# Patient Record
Sex: Female | Born: 1975 | Race: White | Hispanic: No | Marital: Single | Smoking: Former smoker
Health system: Southern US, Community
[De-identification: ages and names within clinical notes are randomized; demographics above are authoritative.]

## PROBLEM LIST (undated history)

## (undated) DIAGNOSIS — E119 Type 2 diabetes mellitus without complications: Secondary | ICD-10-CM

## (undated) DIAGNOSIS — N83201 Unspecified ovarian cyst, right side: Secondary | ICD-10-CM

## (undated) DIAGNOSIS — N83202 Unspecified ovarian cyst, left side: Secondary | ICD-10-CM

## (undated) DIAGNOSIS — K5792 Diverticulitis of intestine, part unspecified, without perforation or abscess without bleeding: Secondary | ICD-10-CM

## (undated) DIAGNOSIS — N200 Calculus of kidney: Secondary | ICD-10-CM

## (undated) HISTORY — PX: COLECTOMY: SHX59

## (undated) HISTORY — PX: ECTOPIC PREGNANCY SURGERY: SHX613

## (undated) HISTORY — PX: ABDOMINAL SURGERY: SHX537

## (undated) HISTORY — PX: ANKLE SURGERY: SHX546

---

## 2008-05-03 ENCOUNTER — Emergency Department: Payer: Self-pay | Admitting: Emergency Medicine

## 2008-12-15 ENCOUNTER — Emergency Department: Payer: Self-pay | Admitting: Emergency Medicine

## 2008-12-16 ENCOUNTER — Ambulatory Visit: Payer: Self-pay | Admitting: Emergency Medicine

## 2009-11-24 ENCOUNTER — Emergency Department: Payer: Self-pay | Admitting: Emergency Medicine

## 2010-03-28 ENCOUNTER — Ambulatory Visit: Payer: Self-pay | Admitting: Podiatry

## 2010-04-07 ENCOUNTER — Ambulatory Visit: Payer: Self-pay | Admitting: Podiatry

## 2010-04-15 ENCOUNTER — Ambulatory Visit: Payer: Self-pay | Admitting: Podiatry

## 2010-06-11 ENCOUNTER — Emergency Department: Payer: Self-pay | Admitting: Emergency Medicine

## 2010-07-02 ENCOUNTER — Inpatient Hospital Stay: Payer: Self-pay | Admitting: Unknown Physician Specialty

## 2010-07-20 ENCOUNTER — Emergency Department (HOSPITAL_COMMUNITY): Admission: EM | Admit: 2010-07-20 | Discharge: 2010-07-21 | Payer: Self-pay | Admitting: Emergency Medicine

## 2010-07-21 ENCOUNTER — Ambulatory Visit: Payer: Self-pay | Admitting: Psychiatry

## 2010-07-21 ENCOUNTER — Inpatient Hospital Stay (HOSPITAL_COMMUNITY): Admission: RE | Admit: 2010-07-21 | Discharge: 2010-07-24 | Payer: Self-pay | Admitting: Psychiatry

## 2010-11-09 ENCOUNTER — Emergency Department: Payer: Self-pay | Admitting: Emergency Medicine

## 2011-01-05 LAB — COMPREHENSIVE METABOLIC PANEL
CO2: 24 mEq/L (ref 19–32)
Calcium: 8.6 mg/dL (ref 8.4–10.5)
Creatinine, Ser: 1.32 mg/dL — ABNORMAL HIGH (ref 0.4–1.2)
GFR calc Af Amer: 55 mL/min — ABNORMAL LOW (ref >60)
GFR calc non Af Amer: 46 mL/min — ABNORMAL LOW (ref >60)
Glucose, Bld: 99 mg/dL (ref 70–99)
Total Protein: 6.9 g/dL (ref 6.0–8.3)

## 2011-01-05 LAB — DIFFERENTIAL
Basophils Absolute: 0.2 10*3/uL — ABNORMAL HIGH (ref 0.0–0.1)
Basophils Relative: 2 % — ABNORMAL HIGH (ref 0–1)
Eosinophils Absolute: 0.1 10*3/uL (ref 0.0–0.7)
Eosinophils Relative: 1 % (ref 0–5)
Lymphocytes Relative: 39 % (ref 12–46)
Lymphs Abs: 3.8 10*3/uL (ref 0.7–4.0)
Neutrophils Relative %: 52 % (ref 43–77)

## 2011-01-05 LAB — POCT PREGNANCY, URINE: Preg Test, Ur: NEGATIVE

## 2011-01-05 LAB — RAPID URINE DRUG SCREEN, HOSP PERFORMED
Amphetamines: NOT DETECTED
Benzodiazepines: POSITIVE — AB
Cocaine: POSITIVE — AB

## 2011-01-05 LAB — CBC
HCT: 37.2 % (ref 36.0–46.0)
Hemoglobin: 12.9 g/dL (ref 12.0–15.0)
MCH: 32.5 pg (ref 26.0–34.0)
MCHC: 34.6 g/dL (ref 30.0–36.0)
RDW: 13.7 % (ref 11.5–15.5)

## 2011-01-05 LAB — TSH: TSH: 2.007 u[IU]/mL (ref 0.350–4.500)

## 2011-02-23 ENCOUNTER — Ambulatory Visit: Payer: Self-pay | Admitting: Podiatry

## 2011-04-18 ENCOUNTER — Ambulatory Visit: Payer: Self-pay | Admitting: Podiatry

## 2011-07-14 ENCOUNTER — Emergency Department: Payer: Self-pay | Admitting: Emergency Medicine

## 2011-09-22 ENCOUNTER — Emergency Department: Payer: Self-pay | Admitting: Emergency Medicine

## 2011-09-24 ENCOUNTER — Inpatient Hospital Stay: Payer: Self-pay | Admitting: Surgery

## 2011-10-03 LAB — PATHOLOGY REPORT

## 2011-10-23 ENCOUNTER — Emergency Department: Payer: Self-pay | Admitting: Emergency Medicine

## 2012-06-30 ENCOUNTER — Emergency Department: Payer: Self-pay | Admitting: Internal Medicine

## 2012-06-30 LAB — COMPREHENSIVE METABOLIC PANEL
Albumin: 3.6 g/dL (ref 3.4–5.0)
Alkaline Phosphatase: 83 U/L (ref 50–136)
Anion Gap: 7 (ref 7–16)
BUN: 16 mg/dL (ref 7–18)
Bilirubin,Total: 0.3 mg/dL (ref 0.2–1.0)
Calcium, Total: 8.9 mg/dL (ref 8.5–10.1)
Chloride: 105 mmol/L (ref 98–107)
Creatinine: 0.94 mg/dL (ref 0.60–1.30)
EGFR (African American): >60
EGFR (Non-African Amer.): >60
Glucose: 149 mg/dL — ABNORMAL HIGH (ref 65–99)
SGPT (ALT): 37 U/L (ref 12–78)
Total Protein: 8 g/dL (ref 6.4–8.2)

## 2012-06-30 LAB — URINALYSIS, COMPLETE
Bilirubin,UR: NEGATIVE
Leukocyte Esterase: NEGATIVE
Nitrite: NEGATIVE
Ph: 6 (ref 4.5–8.0)
Protein: NEGATIVE
RBC,UR: 3 /HPF (ref 0–5)

## 2012-06-30 LAB — CBC
MCH: 31.6 pg (ref 26.0–34.0)
RBC: 4.21 10*6/uL (ref 3.80–5.20)
RDW: 13.1 % (ref 11.5–14.5)
WBC: 9.5 10*3/uL (ref 3.6–11.0)

## 2012-07-06 LAB — CULTURE, BLOOD (SINGLE)

## 2012-09-01 LAB — URINALYSIS, COMPLETE
Bacteria: NONE SEEN
Blood: NEGATIVE
Ketone: NEGATIVE
Leukocyte Esterase: NEGATIVE
Ph: 6 (ref 4.5–8.0)
Protein: NEGATIVE
Specific Gravity: 1.025 (ref 1.003–1.030)
WBC UR: 1 /HPF (ref 0–5)

## 2012-09-01 LAB — COMPREHENSIVE METABOLIC PANEL
Albumin: 3.4 g/dL (ref 3.4–5.0)
Alkaline Phosphatase: 75 U/L (ref 50–136)
BUN: 12 mg/dL (ref 7–18)
Bilirubin,Total: 0.3 mg/dL (ref 0.2–1.0)
Creatinine: 1.01 mg/dL (ref 0.60–1.30)
EGFR (Non-African Amer.): >60
Glucose: 170 mg/dL — ABNORMAL HIGH (ref 65–99)
Osmolality: 283 (ref 275–301)
Potassium: 4.2 mmol/L (ref 3.5–5.1)
Sodium: 140 mmol/L (ref 136–145)
Total Protein: 7.5 g/dL (ref 6.4–8.2)

## 2012-09-02 ENCOUNTER — Inpatient Hospital Stay: Payer: Self-pay | Admitting: Internal Medicine

## 2012-09-02 LAB — CBC
HCT: 34.7 % — ABNORMAL LOW (ref 35.0–47.0)
MCH: 31.7 pg (ref 26.0–34.0)
MCHC: 34.2 g/dL (ref 32.0–36.0)
Platelet: 197 10*3/uL (ref 150–440)

## 2012-09-03 LAB — CBC WITH DIFFERENTIAL/PLATELET
Basophil %: 0.6 %
Eosinophil %: 1.1 %
HCT: 33.5 % — ABNORMAL LOW (ref 35.0–47.0)
HGB: 11.7 g/dL — ABNORMAL LOW (ref 12.0–16.0)
Lymphocyte %: 20 %
MCH: 32 pg (ref 26.0–34.0)
Monocyte #: 0.7 x10 3/mm (ref 0.2–0.9)
Monocyte %: 6.8 %
Neutrophil #: 7 10*3/uL — ABNORMAL HIGH (ref 1.4–6.5)
Neutrophil %: 71.5 %
Platelet: 187 10*3/uL (ref 150–440)
RBC: 3.65 10*6/uL — ABNORMAL LOW (ref 3.80–5.20)
WBC: 9.8 10*3/uL (ref 3.6–11.0)

## 2012-09-03 LAB — BASIC METABOLIC PANEL
Anion Gap: 8 (ref 7–16)
Chloride: 101 mmol/L (ref 98–107)
Co2: 27 mmol/L (ref 21–32)
Creatinine: 0.74 mg/dL (ref 0.60–1.30)
EGFR (African American): >60
EGFR (Non-African Amer.): >60
Osmolality: 271 (ref 275–301)
Potassium: 3.9 mmol/L (ref 3.5–5.1)

## 2012-09-05 LAB — CBC WITH DIFFERENTIAL/PLATELET
Basophil #: 0 10*3/uL (ref 0.0–0.1)
Eosinophil %: 1.5 %
HGB: 11.7 g/dL — ABNORMAL LOW (ref 12.0–16.0)
Lymphocyte #: 1.6 10*3/uL (ref 1.0–3.6)
Lymphocyte %: 21.6 %
Monocyte #: 0.5 x10 3/mm (ref 0.2–0.9)
Monocyte %: 6.8 %
Neutrophil #: 5.2 10*3/uL (ref 1.4–6.5)
Neutrophil %: 69.7 %
Platelet: 210 10*3/uL (ref 150–440)
RDW: 13.6 % (ref 11.5–14.5)
WBC: 7.5 10*3/uL (ref 3.6–11.0)

## 2012-11-29 ENCOUNTER — Emergency Department: Payer: Self-pay | Admitting: Emergency Medicine

## 2012-11-29 LAB — URINALYSIS, COMPLETE
Bilirubin,UR: NEGATIVE
Blood: NEGATIVE
Ketone: NEGATIVE
Nitrite: NEGATIVE
Ph: 5 (ref 4.5–8.0)
Protein: NEGATIVE
RBC,UR: <1 /HPF (ref 0–5)
Squamous Epithelial: 6
WBC UR: 1 /HPF (ref 0–5)

## 2012-11-29 LAB — COMPREHENSIVE METABOLIC PANEL
Albumin: 3.7 g/dL (ref 3.4–5.0)
Alkaline Phosphatase: 80 U/L (ref 50–136)
Anion Gap: 8 (ref 7–16)
BUN: 13 mg/dL (ref 7–18)
Calcium, Total: 8.5 mg/dL (ref 8.5–10.1)
Chloride: 107 mmol/L (ref 98–107)
Creatinine: 0.93 mg/dL (ref 0.60–1.30)
Glucose: 182 mg/dL — ABNORMAL HIGH (ref 65–99)
Osmolality: 282 (ref 275–301)
SGPT (ALT): 45 U/L (ref 12–78)
Sodium: 139 mmol/L (ref 136–145)
Total Protein: 7.7 g/dL (ref 6.4–8.2)

## 2012-11-29 LAB — CBC
HCT: 41.7 % (ref 35.0–47.0)
HGB: 14.2 g/dL (ref 12.0–16.0)
MCV: 92 fL (ref 80–100)
Platelet: 253 10*3/uL (ref 150–440)
RBC: 4.54 10*6/uL (ref 3.80–5.20)
RDW: 13.8 % (ref 11.5–14.5)

## 2012-11-29 LAB — LIPASE, BLOOD: Lipase: 116 U/L (ref 73–393)

## 2012-12-08 ENCOUNTER — Emergency Department: Payer: Self-pay | Admitting: Emergency Medicine

## 2012-12-08 LAB — COMPREHENSIVE METABOLIC PANEL
Albumin: 3.8 g/dL (ref 3.4–5.0)
Alkaline Phosphatase: 94 U/L (ref 50–136)
Anion Gap: 7 (ref 7–16)
BUN: 13 mg/dL (ref 7–18)
Calcium, Total: 9.4 mg/dL (ref 8.5–10.1)
Chloride: 104 mmol/L (ref 98–107)
EGFR (Non-African Amer.): >60
Osmolality: 279 (ref 275–301)
Potassium: 4.1 mmol/L (ref 3.5–5.1)
SGOT(AST): 25 U/L (ref 15–37)
SGPT (ALT): 45 U/L (ref 12–78)

## 2012-12-08 LAB — URINALYSIS, COMPLETE
Bilirubin,UR: NEGATIVE
Blood: NEGATIVE
Hyaline Cast: 3
Leukocyte Esterase: NEGATIVE
Nitrite: NEGATIVE
Protein: NEGATIVE
RBC,UR: <1 /HPF (ref 0–5)
Specific Gravity: 1.02 (ref 1.003–1.030)
Squamous Epithelial: 3

## 2012-12-08 LAB — CBC
HCT: 43.4 % (ref 35.0–47.0)
HGB: 14.4 g/dL (ref 12.0–16.0)
MCH: 30.6 pg (ref 26.0–34.0)
MCHC: 33.2 g/dL (ref 32.0–36.0)
MCV: 92 fL (ref 80–100)
RDW: 13.9 % (ref 11.5–14.5)

## 2012-12-12 ENCOUNTER — Other Ambulatory Visit: Payer: Self-pay | Admitting: Gastroenterology

## 2012-12-12 LAB — SEDIMENTATION RATE: Erythrocyte Sed Rate: 47 mm/hr — ABNORMAL HIGH (ref 0–20)

## 2013-01-08 ENCOUNTER — Ambulatory Visit: Payer: Self-pay | Admitting: Gastroenterology

## 2013-01-09 LAB — PATHOLOGY REPORT

## 2013-03-11 ENCOUNTER — Emergency Department: Payer: Self-pay | Admitting: Emergency Medicine

## 2013-03-11 LAB — COMPREHENSIVE METABOLIC PANEL
Albumin: 3.5 g/dL (ref 3.4–5.0)
Alkaline Phosphatase: 87 U/L (ref 50–136)
Anion Gap: 6 — ABNORMAL LOW (ref 7–16)
BUN: 13 mg/dL (ref 7–18)
Bilirubin,Total: 0.2 mg/dL (ref 0.2–1.0)
Co2: 25 mmol/L (ref 21–32)
EGFR (Non-African Amer.): >60
Glucose: 159 mg/dL — ABNORMAL HIGH (ref 65–99)
Potassium: 4.2 mmol/L (ref 3.5–5.1)
SGOT(AST): 25 U/L (ref 15–37)
Sodium: 138 mmol/L (ref 136–145)
Total Protein: 7.8 g/dL (ref 6.4–8.2)

## 2013-03-11 LAB — URINALYSIS, COMPLETE
Bilirubin,UR: NEGATIVE
Ketone: NEGATIVE
Leukocyte Esterase: NEGATIVE
Nitrite: NEGATIVE
Ph: 5 (ref 4.5–8.0)
Specific Gravity: 1.015 (ref 1.003–1.030)
Squamous Epithelial: 1
WBC UR: <1 /HPF (ref 0–5)

## 2013-03-11 LAB — CBC
HGB: 13.8 g/dL (ref 12.0–16.0)
MCH: 31.5 pg (ref 26.0–34.0)
MCHC: 34.5 g/dL (ref 32.0–36.0)
MCV: 91 fL (ref 80–100)
Platelet: 264 10*3/uL (ref 150–440)
RBC: 4.4 10*6/uL (ref 3.80–5.20)
RDW: 13.4 % (ref 11.5–14.5)
WBC: 10.2 10*3/uL (ref 3.6–11.0)

## 2013-03-11 LAB — LIPASE, BLOOD: Lipase: 98 U/L (ref 73–393)

## 2013-03-11 LAB — PREGNANCY, URINE: Pregnancy Test, Urine: NEGATIVE m[IU]/mL

## 2013-03-24 ENCOUNTER — Emergency Department: Payer: Self-pay | Admitting: Emergency Medicine

## 2013-03-24 LAB — COMPREHENSIVE METABOLIC PANEL
Albumin: 3.4 g/dL (ref 3.4–5.0)
Anion Gap: 5 — ABNORMAL LOW (ref 7–16)
BUN: 11 mg/dL (ref 7–18)
Bilirubin,Total: 0.4 mg/dL (ref 0.2–1.0)
Calcium, Total: 9.6 mg/dL (ref 8.5–10.1)
Co2: 29 mmol/L (ref 21–32)
Creatinine: 0.84 mg/dL (ref 0.60–1.30)
EGFR (African American): >60
Glucose: 123 mg/dL — ABNORMAL HIGH (ref 65–99)
Potassium: 3.7 mmol/L (ref 3.5–5.1)
SGOT(AST): 22 U/L (ref 15–37)
Sodium: 135 mmol/L — ABNORMAL LOW (ref 136–145)

## 2013-03-24 LAB — URINALYSIS, COMPLETE
Bilirubin,UR: NEGATIVE
Blood: NEGATIVE
Glucose,UR: NEGATIVE mg/dL (ref 0–75)
Leukocyte Esterase: NEGATIVE
Nitrite: NEGATIVE
Ph: 5 (ref 4.5–8.0)
Protein: NEGATIVE
Specific Gravity: 1.024 (ref 1.003–1.030)
Squamous Epithelial: 2

## 2013-03-24 LAB — CBC
HCT: 40.1 % (ref 35.0–47.0)
MCHC: 34.1 g/dL (ref 32.0–36.0)
MCV: 91 fL (ref 80–100)
Platelet: 307 10*3/uL (ref 150–440)
RBC: 4.41 10*6/uL (ref 3.80–5.20)

## 2013-03-24 LAB — HCG, QUANTITATIVE, PREGNANCY: Beta Hcg, Quant.: <1 m[IU]/mL — ABNORMAL LOW

## 2013-06-02 ENCOUNTER — Emergency Department: Payer: Self-pay | Admitting: Emergency Medicine

## 2013-06-02 LAB — URINALYSIS, COMPLETE
Bacteria: NONE SEEN
Blood: NEGATIVE
Ketone: NEGATIVE
Nitrite: NEGATIVE
Protein: NEGATIVE
RBC,UR: 1 /HPF (ref 0–5)
WBC UR: 2 /HPF (ref 0–5)

## 2013-06-02 LAB — COMPREHENSIVE METABOLIC PANEL
Albumin: 3.1 g/dL — ABNORMAL LOW (ref 3.4–5.0)
Alkaline Phosphatase: 89 U/L (ref 50–136)
Anion Gap: 5 — ABNORMAL LOW (ref 7–16)
BUN: 14 mg/dL (ref 7–18)
Calcium, Total: 8.8 mg/dL (ref 8.5–10.1)
Co2: 28 mmol/L (ref 21–32)
Glucose: 273 mg/dL — ABNORMAL HIGH (ref 65–99)
SGPT (ALT): 31 U/L (ref 12–78)
Sodium: 135 mmol/L — ABNORMAL LOW (ref 136–145)
Total Protein: 7.8 g/dL (ref 6.4–8.2)

## 2013-06-02 LAB — CBC
HCT: 36 % (ref 35.0–47.0)
MCH: 31.8 pg (ref 26.0–34.0)
MCHC: 35 g/dL (ref 32.0–36.0)
RBC: 3.96 10*6/uL (ref 3.80–5.20)

## 2013-06-02 LAB — TROPONIN I: Troponin-I: <0.02 ng/mL

## 2013-06-03 LAB — DRUG SCREEN, URINE
Amphetamines, Ur Screen: NEGATIVE (ref ?–1000)
Cocaine Metabolite,Ur ~~LOC~~: NEGATIVE (ref ?–300)
MDMA (Ecstasy)Ur Screen: NEGATIVE (ref ?–500)
Methadone, Ur Screen: NEGATIVE (ref ?–300)
Tricyclic, Ur Screen: NEGATIVE (ref ?–1000)

## 2013-06-29 ENCOUNTER — Emergency Department: Payer: Self-pay | Admitting: Emergency Medicine

## 2013-06-29 LAB — URINALYSIS, COMPLETE
Bilirubin,UR: NEGATIVE
Blood: NEGATIVE
Nitrite: NEGATIVE
Protein: NEGATIVE
RBC,UR: NONE SEEN /HPF (ref 0–5)
Specific Gravity: 1.018 (ref 1.003–1.030)
Squamous Epithelial: 9

## 2013-06-29 LAB — BASIC METABOLIC PANEL
Anion Gap: 3 — ABNORMAL LOW (ref 7–16)
BUN: 12 mg/dL (ref 7–18)
Creatinine: 0.92 mg/dL (ref 0.60–1.30)
Potassium: 4.1 mmol/L (ref 3.5–5.1)
Sodium: 134 mmol/L — ABNORMAL LOW (ref 136–145)

## 2013-06-29 LAB — CBC
HCT: 41.1 % (ref 35.0–47.0)
MCH: 31.2 pg (ref 26.0–34.0)
MCV: 90 fL (ref 80–100)
WBC: 11.2 10*3/uL — ABNORMAL HIGH (ref 3.6–11.0)

## 2013-06-29 LAB — HEPATIC FUNCTION PANEL A (ARMC)
Albumin: 3.5 g/dL (ref 3.4–5.0)
Alkaline Phosphatase: 108 U/L (ref 50–136)
Bilirubin,Total: 0.4 mg/dL (ref 0.2–1.0)
SGPT (ALT): 41 U/L (ref 12–78)

## 2013-06-29 LAB — PREGNANCY, URINE: Pregnancy Test, Urine: NEGATIVE m[IU]/mL

## 2013-07-22 ENCOUNTER — Emergency Department: Payer: Self-pay | Admitting: Emergency Medicine

## 2013-07-22 LAB — COMPREHENSIVE METABOLIC PANEL
Albumin: 3.6 g/dL (ref 3.4–5.0)
Anion Gap: 4 — ABNORMAL LOW (ref 7–16)
BUN: 13 mg/dL (ref 7–18)
Bilirubin,Total: 0.3 mg/dL (ref 0.2–1.0)
Chloride: 104 mmol/L (ref 98–107)
Creatinine: 1.2 mg/dL (ref 0.60–1.30)
EGFR (African American): >60
EGFR (Non-African Amer.): 57 — ABNORMAL LOW
Glucose: 213 mg/dL — ABNORMAL HIGH (ref 65–99)
Potassium: 4 mmol/L (ref 3.5–5.1)
SGOT(AST): 34 U/L (ref 15–37)
Total Protein: 8.5 g/dL — ABNORMAL HIGH (ref 6.4–8.2)

## 2013-07-22 LAB — CBC
HCT: 42.1 % (ref 35.0–47.0)
HGB: 14.5 g/dL (ref 12.0–16.0)
MCV: 90 fL (ref 80–100)
Platelet: 265 10*3/uL (ref 150–440)
RBC: 4.67 10*6/uL (ref 3.80–5.20)
RDW: 13.9 % (ref 11.5–14.5)

## 2013-07-22 LAB — URINALYSIS, COMPLETE
Bacteria: NONE SEEN
Bilirubin,UR: NEGATIVE
Blood: NEGATIVE
Glucose,UR: NEGATIVE mg/dL (ref 0–75)
Ketone: NEGATIVE
Nitrite: NEGATIVE
Ph: 5 (ref 4.5–8.0)
Protein: NEGATIVE
RBC,UR: <1 /HPF (ref 0–5)

## 2013-07-22 LAB — LIPASE, BLOOD: Lipase: 111 U/L (ref 73–393)

## 2013-07-28 ENCOUNTER — Emergency Department: Payer: Self-pay | Admitting: Emergency Medicine

## 2013-07-28 LAB — URINALYSIS, COMPLETE
Bilirubin,UR: NEGATIVE
Blood: NEGATIVE
Hyaline Cast: 2
Ketone: NEGATIVE
Nitrite: NEGATIVE
Ph: 6 (ref 4.5–8.0)
RBC,UR: 1 /HPF (ref 0–5)
Specific Gravity: 1.02 (ref 1.003–1.030)

## 2013-07-28 LAB — COMPREHENSIVE METABOLIC PANEL
Alkaline Phosphatase: 100 U/L (ref 50–136)
Anion Gap: 5 — ABNORMAL LOW (ref 7–16)
BUN: 12 mg/dL (ref 7–18)
Bilirubin,Total: 0.3 mg/dL (ref 0.2–1.0)
Calcium, Total: 9.3 mg/dL (ref 8.5–10.1)
Chloride: 101 mmol/L (ref 98–107)
Creatinine: 0.98 mg/dL (ref 0.60–1.30)
EGFR (Non-African Amer.): >60
Glucose: 189 mg/dL — ABNORMAL HIGH (ref 65–99)
Potassium: 3.9 mmol/L (ref 3.5–5.1)
SGOT(AST): 38 U/L — ABNORMAL HIGH (ref 15–37)
SGPT (ALT): 44 U/L (ref 12–78)
Sodium: 134 mmol/L — ABNORMAL LOW (ref 136–145)

## 2013-07-28 LAB — CBC
HCT: 41.4 % (ref 35.0–47.0)
MCHC: 35.5 g/dL (ref 32.0–36.0)
RBC: 4.61 10*6/uL (ref 3.80–5.20)
RDW: 13.8 % (ref 11.5–14.5)

## 2013-07-28 LAB — LIPASE, BLOOD: Lipase: 80 U/L (ref 73–393)

## 2013-09-22 ENCOUNTER — Emergency Department: Payer: Self-pay | Admitting: Emergency Medicine

## 2013-09-22 LAB — COMPREHENSIVE METABOLIC PANEL
Albumin: 3.3 g/dL — ABNORMAL LOW (ref 3.4–5.0)
Alkaline Phosphatase: 97 U/L
Anion Gap: 5 — ABNORMAL LOW (ref 7–16)
BUN: 10 mg/dL (ref 7–18)
Calcium, Total: 8.7 mg/dL (ref 8.5–10.1)
Co2: 28 mmol/L (ref 21–32)
Creatinine: 0.93 mg/dL (ref 0.60–1.30)
EGFR (African American): >60
Glucose: 306 mg/dL — ABNORMAL HIGH (ref 65–99)
Osmolality: 277 (ref 275–301)
SGOT(AST): 29 U/L (ref 15–37)
SGPT (ALT): 34 U/L (ref 12–78)
Total Protein: 7.8 g/dL (ref 6.4–8.2)

## 2013-09-22 LAB — CBC WITH DIFFERENTIAL/PLATELET
Basophil #: 0.1 10*3/uL (ref 0.0–0.1)
Basophil %: 0.8 %
HGB: 13.5 g/dL (ref 12.0–16.0)
Lymphocyte #: 3.7 10*3/uL — ABNORMAL HIGH (ref 1.0–3.6)
Lymphocyte %: 35.6 %
MCH: 31.1 pg (ref 26.0–34.0)
MCHC: 34.4 g/dL (ref 32.0–36.0)
MCV: 90 fL (ref 80–100)
Monocyte %: 4.2 %
Neutrophil #: 6 10*3/uL (ref 1.4–6.5)
Neutrophil %: 58.2 %
RBC: 4.35 10*6/uL (ref 3.80–5.20)
RDW: 13.1 % (ref 11.5–14.5)
WBC: 10.3 10*3/uL (ref 3.6–11.0)

## 2013-09-22 LAB — URINALYSIS, COMPLETE
Bilirubin,UR: NEGATIVE
Blood: NEGATIVE
Glucose,UR: 50 mg/dL (ref 0–75)
Ketone: NEGATIVE
Leukocyte Esterase: NEGATIVE
Specific Gravity: 1.01 (ref 1.003–1.030)
Squamous Epithelial: 2

## 2013-10-28 ENCOUNTER — Emergency Department (HOSPITAL_COMMUNITY): Payer: Medicaid Other

## 2013-10-28 ENCOUNTER — Emergency Department (HOSPITAL_COMMUNITY)
Admission: EM | Admit: 2013-10-28 | Discharge: 2013-10-28 | Disposition: A | Discharge status: Home / Self Care | Payer: Medicaid Other | Attending: Emergency Medicine | Admitting: Emergency Medicine

## 2013-10-28 ENCOUNTER — Encounter (HOSPITAL_COMMUNITY): Payer: Self-pay | Admitting: Emergency Medicine

## 2013-10-28 DIAGNOSIS — R5383 Other fatigue: Secondary | ICD-10-CM

## 2013-10-28 DIAGNOSIS — Z91040 Latex allergy status: Secondary | ICD-10-CM | POA: Insufficient documentation

## 2013-10-28 DIAGNOSIS — R109 Unspecified abdominal pain: Secondary | ICD-10-CM

## 2013-10-28 DIAGNOSIS — R141 Gas pain: Secondary | ICD-10-CM | POA: Insufficient documentation

## 2013-10-28 DIAGNOSIS — Z3202 Encounter for pregnancy test, result negative: Secondary | ICD-10-CM | POA: Insufficient documentation

## 2013-10-28 DIAGNOSIS — Z79899 Other long term (current) drug therapy: Secondary | ICD-10-CM | POA: Insufficient documentation

## 2013-10-28 DIAGNOSIS — R197 Diarrhea, unspecified: Secondary | ICD-10-CM | POA: Insufficient documentation

## 2013-10-28 DIAGNOSIS — R5381 Other malaise: Secondary | ICD-10-CM | POA: Insufficient documentation

## 2013-10-28 DIAGNOSIS — Z87442 Personal history of urinary calculi: Secondary | ICD-10-CM | POA: Insufficient documentation

## 2013-10-28 DIAGNOSIS — Z8719 Personal history of other diseases of the digestive system: Secondary | ICD-10-CM | POA: Insufficient documentation

## 2013-10-28 DIAGNOSIS — R142 Eructation: Secondary | ICD-10-CM | POA: Insufficient documentation

## 2013-10-28 DIAGNOSIS — Z87891 Personal history of nicotine dependence: Secondary | ICD-10-CM | POA: Insufficient documentation

## 2013-10-28 DIAGNOSIS — R1084 Generalized abdominal pain: Secondary | ICD-10-CM | POA: Insufficient documentation

## 2013-10-28 DIAGNOSIS — R143 Flatulence: Secondary | ICD-10-CM

## 2013-10-28 HISTORY — DX: Diverticulitis of intestine, part unspecified, without perforation or abscess without bleeding: K57.92

## 2013-10-28 HISTORY — DX: Calculus of kidney: N20.0

## 2013-10-28 LAB — CBC WITH DIFFERENTIAL/PLATELET
Basophils Absolute: 0 10*3/uL (ref 0.0–0.1)
Basophils Relative: 0 % (ref 0–1)
EOS PCT: 1 % (ref 0–5)
Eosinophils Absolute: 0.1 10*3/uL (ref 0.0–0.7)
HEMATOCRIT: 39.5 % (ref 36.0–46.0)
HEMOGLOBIN: 14.3 g/dL (ref 12.0–15.0)
LYMPHS ABS: 3 10*3/uL (ref 0.7–4.0)
LYMPHS PCT: 32 % (ref 12–46)
MCH: 32.1 pg (ref 26.0–34.0)
MCHC: 36.2 g/dL — ABNORMAL HIGH (ref 30.0–36.0)
MCV: 88.8 fL (ref 78.0–100.0)
MONO ABS: 0.3 10*3/uL (ref 0.1–1.0)
MONOS PCT: 3 % (ref 3–12)
Neutro Abs: 6 10*3/uL (ref 1.7–7.7)
Neutrophils Relative %: 64 % (ref 43–77)
Platelets: 252 10*3/uL (ref 150–400)
RBC: 4.45 MIL/uL (ref 3.87–5.11)
RDW: 12.8 % (ref 11.5–15.5)
WBC: 9.4 10*3/uL (ref 4.0–10.5)

## 2013-10-28 LAB — COMPREHENSIVE METABOLIC PANEL
ALT: 41 U/L — AB (ref 0–35)
AST: 44 U/L — ABNORMAL HIGH (ref 0–37)
Albumin: 3.4 g/dL — ABNORMAL LOW (ref 3.5–5.2)
Alkaline Phosphatase: 96 U/L (ref 39–117)
BUN: 13 mg/dL (ref 6–23)
CALCIUM: 9 mg/dL (ref 8.4–10.5)
CO2: 24 meq/L (ref 19–32)
Chloride: 96 mEq/L (ref 96–112)
Creatinine, Ser: 0.86 mg/dL (ref 0.50–1.10)
GFR, EST NON AFRICAN AMERICAN: 85 mL/min — AB (ref >90)
GLUCOSE: 307 mg/dL — AB (ref 70–99)
Potassium: 4.5 mEq/L (ref 3.7–5.3)
SODIUM: 135 meq/L — AB (ref 137–147)
Total Bilirubin: 0.2 mg/dL — ABNORMAL LOW (ref 0.3–1.2)
Total Protein: 7.8 g/dL (ref 6.0–8.3)

## 2013-10-28 LAB — URINALYSIS, ROUTINE W REFLEX MICROSCOPIC
BILIRUBIN URINE: NEGATIVE
Glucose, UA: 500 mg/dL — AB
HGB URINE DIPSTICK: NEGATIVE
Ketones, ur: NEGATIVE mg/dL
Leukocytes, UA: NEGATIVE
Nitrite: NEGATIVE
PROTEIN: NEGATIVE mg/dL
Specific Gravity, Urine: 1.025 (ref 1.005–1.030)
UROBILINOGEN UA: 0.2 mg/dL (ref 0.0–1.0)
pH: 5 (ref 5.0–8.0)

## 2013-10-28 LAB — LIPASE, BLOOD: Lipase: 24 U/L (ref 11–59)

## 2013-10-28 LAB — POCT I-STAT TROPONIN I: TROPONIN I, POC: 0 ng/mL (ref 0.00–0.08)

## 2013-10-28 LAB — POCT PREGNANCY, URINE: Preg Test, Ur: NEGATIVE

## 2013-10-28 MED ORDER — ONDANSETRON HCL 4 MG/2ML IJ SOLN
4.0000 mg | Freq: Once | INTRAMUSCULAR | Status: AC
  Administered 2013-10-28: 4 mg via INTRAVENOUS
  Filled 2013-10-28: qty 2

## 2013-10-28 MED ORDER — HYDROMORPHONE HCL PF 1 MG/ML IJ SOLN
1.0000 mg | Freq: Once | INTRAMUSCULAR | Status: AC
Start: 2013-10-28 — End: 2013-10-28
  Administered 2013-10-28: 1 mg via INTRAVENOUS
  Filled 2013-10-28: qty 1

## 2013-10-28 MED ORDER — ONDANSETRON HCL 4 MG PO TABS: 4.0000 mg | ORAL_TABLET | Freq: Four times a day (QID) | ORAL | Status: AC

## 2013-10-28 MED ORDER — IOHEXOL 300 MG/ML  SOLN
100.0000 mL | Freq: Once | INTRAMUSCULAR | Status: AC | PRN
  Administered 2013-10-28: 100 mL via INTRAVENOUS

## 2013-10-28 MED ORDER — OXYCODONE-ACETAMINOPHEN 5-325 MG PO TABS: 1.0000 | ORAL_TABLET | Freq: Four times a day (QID) | ORAL | Status: DC | PRN

## 2013-10-28 MED ORDER — IOHEXOL 300 MG/ML  SOLN
25.0000 mL | Freq: Once | INTRAMUSCULAR | Status: AC | PRN
  Administered 2013-10-28: 25 mL via ORAL

## 2013-10-28 MED ORDER — OXYCODONE-ACETAMINOPHEN 5-325 MG PO TABS
2.0000 | ORAL_TABLET | Freq: Once | ORAL | Status: AC
  Administered 2013-10-28: 2 via ORAL
  Filled 2013-10-28: qty 2

## 2013-10-28 NOTE — ED Provider Notes (Signed)
CSN: [id number]     Arrival date & time 10/28/13  1544 History   First MD Initiated Contact with Patient 10/28/13 1913     Chief Complaint  Patient presents with  . Abdominal Pain   (Consider location/radiation/quality/duration/timing/severity/associated sxs/prior Treatment) HPI Comments: Patient is 38 year old female who presents to the ED with complaints of generalized abdominal pain.  She reports that she has a history of both kidney stones and diverticulitis with colon resection in the past.  She states she is never sure if her pain is related to stones or infection.  She denies nausea, vomiting, constipation with this.  She reports occasional diarrhea.  She reports no vaginal discharge or bleeding but reports no menstrual period for the past 6 months.  She states she is not sexually active at this time either.  She states that she also has a history of "cysts on my ovaries" but denies being diagnosed with PCOS.  She denies dysuria or gross hematuria as well.  Patient is a 38 y.o. female presenting with abdominal pain. The history is provided by the patient. No language interpreter was used.  Abdominal Pain Pain location:  Generalized Pain quality: aching, sharp and shooting   Pain radiates to:  Does not radiate Pain severity:  Moderate Onset quality:  Gradual Duration:  5 days Timing:  Constant Progression:  Worsening Chronicity:  Recurrent Context: previous surgery   Context: not alcohol use, not diet changes, not eating, not medication withdrawal, not recent sexual activity, not recent travel, not retching, not sick contacts and not suspicious food intake   Relieved by:  Nothing Worsened by:  Nothing tried Ineffective treatments:  None tried Associated symptoms: belching, diarrhea, fatigue and flatus   Associated symptoms: no anorexia, no chest pain, no chills, no constipation, no cough, no dysuria, no fever, no hematemesis, no hematochezia, no hematuria, no melena, no nausea, no  shortness of breath, no vaginal bleeding, no vaginal discharge and no vomiting   Risk factors: has not had multiple surgeries     Past Medical History  Diagnosis Date  . Diverticulitis   . Kidney stones    Past Surgical History  Procedure Laterality Date  . Abdominal surgery      colon resection   No family history on file. History  Substance Use Topics  . Smoking status: Former Games developer  . Smokeless tobacco: Not on file  . Alcohol Use: No   OB History   Grav Para Term Preterm Abortions TAB SAB Ect Mult Living                 Review of Systems  Constitutional: Positive for fatigue. Negative for fever and chills.  Respiratory: Negative for cough and shortness of breath.   Cardiovascular: Negative for chest pain.  Gastrointestinal: Positive for abdominal pain, diarrhea and flatus. Negative for nausea, vomiting, constipation, melena, hematochezia, anorexia and hematemesis.  Genitourinary: Negative for dysuria, hematuria, vaginal bleeding and vaginal discharge.  All other systems reviewed and are negative.    Allergies  Morphine and related and Latex  Home Medications   Current Outpatient Rx  Name  Route  Sig  Dispense  Refill  . acetaminophen (TYLENOL) 500 MG tablet   Oral   Take 1,000 mg by mouth every 6 (six) hours as needed for moderate pain.         . metFORMIN (GLUCOPHAGE) 500 MG tablet   Oral   Take 500 mg by mouth daily with breakfast.  BP 155/98  Pulse 109  Temp(Src) 98.1 F (36.7 C) (Oral)  Resp 20  SpO2 95% Physical Exam  Nursing note and vitals reviewed. Constitutional: She is oriented to person, place, and time. She appears well-developed and well-nourished. No distress.  HENT:  Head: Normocephalic and atraumatic.  Right Ear: External ear normal.  Left Ear: External ear normal.  Nose: Nose normal.  Mouth/Throat: Oropharynx is clear and moist. No oropharyngeal exudate.  Eyes: Conjunctivae are normal. Pupils are equal, round, and  reactive to light. No scleral icterus.  Neck: Normal range of motion. Neck supple.  Cardiovascular: Normal rate, regular rhythm and normal heart sounds.  Exam reveals no gallop and no friction rub.   No murmur heard. Pulmonary/Chest: Effort normal and breath sounds normal. No respiratory distress. She has no wheezes. She has no rales. She exhibits no tenderness.  Abdominal: Soft. Bowel sounds are normal. She exhibits no distension and no mass. There is generalized tenderness. There is no rebound, no guarding and no CVA tenderness.    Musculoskeletal: Normal range of motion. She exhibits no edema and no tenderness.  Lymphadenopathy:    She has no cervical adenopathy.  Neurological: She is alert and oriented to person, place, and time. She exhibits normal muscle tone. Coordination normal.  Skin: Skin is warm and dry. No rash noted. No erythema. No pallor.  Psychiatric: She has a normal mood and affect. Her behavior is normal. Judgment and thought content normal.    ED Course  Procedures (including critical care time) Labs Review Labs Reviewed  CBC WITH DIFFERENTIAL - Abnormal; Notable for the following:    MCHC 36.2 (*)    All other components within normal limits  COMPREHENSIVE METABOLIC PANEL - Abnormal; Notable for the following:    Sodium 135 (*)    Glucose, Bld 307 (*)    Albumin 3.4 (*)    AST 44 (*)    ALT 41 (*)    Total Bilirubin 0.2 (*)    GFR calc non Af Amer 85 (*)    All other components within normal limits  LIPASE, BLOOD  URINALYSIS, ROUTINE W REFLEX MICROSCOPIC  POCT I-STAT TROPONIN I   Imaging Review No results found.  EKG Interpretation   None      Results for orders placed during the hospital encounter of 10/28/13  CBC WITH DIFFERENTIAL      Result Value Range   WBC 9.4  4.0 - 10.5 K/uL   RBC 4.45  3.87 - 5.11 MIL/uL   Hemoglobin 14.3  12.0 - 15.0 g/dL   HCT 45.439.5  09.836.0 - 11.946.0 %   MCV 88.8  78.0 - 100.0 fL   MCH 32.1  26.0 - 34.0 pg   MCHC 36.2 (*)  30.0 - 36.0 g/dL   RDW 14.712.8  82.911.5 - 56.215.5 %   Platelets 252  150 - 400 K/uL   Neutrophils Relative % 64  43 - 77 %   Neutro Abs 6.0  1.7 - 7.7 K/uL   Lymphocytes Relative 32  12 - 46 %   Lymphs Abs 3.0  0.7 - 4.0 K/uL   Monocytes Relative 3  3 - 12 %   Monocytes Absolute 0.3  0.1 - 1.0 K/uL   Eosinophils Relative 1  0 - 5 %   Eosinophils Absolute 0.1  0.0 - 0.7 K/uL   Basophils Relative 0  0 - 1 %   Basophils Absolute 0.0  0.0 - 0.1 K/uL  COMPREHENSIVE METABOLIC PANEL  Result Value Range   Sodium 135 (*) 137 - 147 mEq/L   Potassium 4.5  3.7 - 5.3 mEq/L   Chloride 96  96 - 112 mEq/L   CO2 24  19 - 32 mEq/L   Glucose, Bld 307 (*) 70 - 99 mg/dL   BUN 13  6 - 23 mg/dL   Creatinine, Ser 0.86  0.50 - 1.10 mg/dL   Calcium 9.0  8.4 - 57.8 mg/dL   Total Protein 7.8  6.0 - 8.3 g/dL   Albumin 3.4 (*) 3.5 - 5.2 g/dL   AST 44 (*) 0 - 37 U/L   ALT 41 (*) 0 - 35 U/L   Alkaline Phosphatase 96  39 - 117 U/L   Total Bilirubin 0.2 (*) 0.3 - 1.2 mg/dL   GFR calc non Af Amer 85 (*) >90 mL/min   GFR calc Af Amer >90  >90 mL/min  LIPASE, BLOOD      Result Value Range   Lipase 24  11 - 59 U/L  URINALYSIS, ROUTINE W REFLEX MICROSCOPIC      Result Value Range   Color, Urine YELLOW  YELLOW   APPearance CLOUDY (*) CLEAR   Specific Gravity, Urine 1.025  1.005 - 1.030   pH 5.0  5.0 - 8.0   Glucose, UA 500 (*) NEGATIVE mg/dL   Hgb urine dipstick NEGATIVE  NEGATIVE   Bilirubin Urine NEGATIVE  NEGATIVE   Ketones, ur NEGATIVE  NEGATIVE mg/dL   Protein, ur NEGATIVE  NEGATIVE mg/dL   Urobilinogen, UA 0.2  0.0 - 1.0 mg/dL   Nitrite NEGATIVE  NEGATIVE   Leukocytes, UA NEGATIVE  NEGATIVE  POCT I-STAT TROPONIN I      Result Value Range   Troponin i, poc 0.00  0.00 - 0.08 ng/mL   Comment 3           POCT PREGNANCY, URINE      Result Value Range   Preg Test, Ur NEGATIVE  NEGATIVE   No results found.  8:48 PM Care of patient turned over to Carmie Kanner, PA-C who will disposition the patient.  MDM       Izola Price Marisue Humble, [place] 10/28/13 2049

## 2013-10-28 NOTE — Discharge Instructions (Signed)
Recommend Tylenol or ibuprofen for pain control. You may take Percocet as prescribed for breakthrough pain. You may takes Zofran as needed for nausea/vomiting. Followup with women's outpatient clinic for further evaluation of symptoms. Follow up with your gastroenterologist as well. Return if symptoms worsen.  Abdominal Pain, Women Abdominal (stomach, pelvic, or belly) pain can be caused by many things. It is important to tell your doctor:  The location of the pain.  Does it come and go or is it present all the time?  Are there things that start the pain (eating certain foods, exercise)?  Are there other symptoms associated with the pain (fever, nausea, vomiting, diarrhea)? All of this is helpful to know when trying to find the cause of the pain. CAUSES   Stomach: virus or bacteria infection, or ulcer.  Intestine: appendicitis (inflamed appendix), regional ileitis (Crohn's disease), ulcerative colitis (inflamed colon), irritable bowel syndrome, diverticulitis (inflamed diverticulum of the colon), or cancer of the stomach or intestine.  Gallbladder disease or stones in the gallbladder.  Kidney disease, kidney stones, or infection.  Pancreas infection or cancer.  Fibromyalgia (pain disorder).  Diseases of the female organs:  Uterus: fibroid (non-cancerous) tumors or infection.  Fallopian tubes: infection or tubal pregnancy.  Ovary: cysts or tumors.  Pelvic adhesions (scar tissue).  Endometriosis (uterus lining tissue growing in the pelvis and on the pelvic organs).  Pelvic congestion syndrome (female organs filling up with blood just before the menstrual period).  Pain with the menstrual period.  Pain with ovulation (producing an egg).  Pain with an IUD (intrauterine device, birth control) in the uterus.  Cancer of the female organs.  Functional pain (pain not caused by a disease, may improve without treatment).  Psychological pain.  Depression. DIAGNOSIS  Your  doctor will decide the seriousness of your pain by doing an examination.  Blood tests.  X-rays.  Ultrasound.  CT scan (computed tomography, special type of X-ray).  MRI (magnetic resonance imaging).  Cultures, for infection.  Barium enema (dye inserted in the large intestine, to better view it with X-rays).  Colonoscopy (looking in intestine with a lighted tube).  Laparoscopy (minor surgery, looking in abdomen with a lighted tube).  Major abdominal exploratory surgery (looking in abdomen with a large incision). TREATMENT  The treatment will depend on the cause of the pain.   Many cases can be observed and treated at home.  Over-the-counter medicines recommended by your caregiver.  Prescription medicine.  Antibiotics, for infection.  Birth control pills, for painful periods or for ovulation pain.  Hormone treatment, for endometriosis.  Nerve blocking injections.  Physical therapy.  Antidepressants.  Counseling with a psychologist or psychiatrist.  Minor or major surgery. HOME CARE INSTRUCTIONS   Do not take laxatives, unless directed by your caregiver.  Take over-the-counter pain medicine only if ordered by your caregiver. Do not take aspirin because it can cause an upset stomach or bleeding.  Try a clear liquid diet (broth or water) as ordered by your caregiver. Slowly move to a bland diet, as tolerated, if the pain is related to the stomach or intestine.  Have a thermometer and take your temperature several times a day, and record it.  Bed rest and sleep, if it helps the pain.  Avoid sexual intercourse, if it causes pain.  Avoid stressful situations.  Keep your follow-up appointments and tests, as your caregiver orders.  If the pain does not go away with medicine or surgery, you may try:  Acupuncture.  Relaxation exercises (yoga,  meditation).  Group therapy.  Counseling. SEEK MEDICAL CARE IF:   You notice certain foods cause stomach  pain.  Your home care treatment is not helping your pain.  You need stronger pain medicine.  You want your IUD removed.  You feel faint or lightheaded.  You develop nausea and vomiting.  You develop a rash.  You are having side effects or an allergy to your medicine. SEEK IMMEDIATE MEDICAL CARE IF:   Your pain does not go away or gets worse.  You have a fever.  Your pain is felt only in portions of the abdomen. The right side could possibly be appendicitis. The left lower portion of the abdomen could be colitis or diverticulitis.  You are passing blood in your stools (bright red or black tarry stools, with or without vomiting).  You have blood in your urine.  You develop chills, with or without a fever.  You pass out. MAKE SURE YOU:   Understand these instructions.  Will watch your condition.  Will get help right away if you are not doing well or get worse. Document Released: 08/06/2007 Document Revised: 01/01/2012 Document Reviewed: 08/26/2009 [facility] Patient Information 2014 [place], [place].   Emergency Department Resource Guide 1) Find a Doctor and Pay Out of Pocket Although you won't have to find out who is covered by your insurance plan, it is a good idea to ask around and get recommendations. You will then need to call the office and see if the doctor you have chosen will accept you as a new patient and what types of options they offer for patients who are self-pay. Some doctors offer discounts or will set up payment plans for their patients who do not have insurance, but you will need to ask so you aren't surprised when you get to your appointment.  2) Contact Your Local Health Department Not all health departments have doctors that can see patients for sick visits, but many do, so it is worth a call to see if yours does. If you don't know where your local health department is, you can check in your phone book. The CDC also has a tool to help you locate your  state's health department, and many state websites also have listings of all of their local health departments.  3) Find a Walk-in Clinic If your illness is not likely to be very severe or complicated, you may want to try a walk in clinic. These are popping up all over the country in pharmacies, drugstores, and shopping centers. They're usually staffed by nurse practitioners or physician assistants that have been trained to treat common illnesses and complaints. They're usually fairly quick and inexpensive. However, if you have serious medical issues or chronic medical problems, these are probably not your best option.  No Primary Care Doctor: - Call Health Connect at  [phone] - they can help you locate a primary care doctor that  accepts your insurance, provides certain services, etc. - Physician Referral Service- [phone]  Chronic Pain Problems: Organization         Address  Phone   Notes  [facility]  [phone] Patients need to be referred by their primary care doctor.   Medication Assistance: Organization         Address  Phone   Notes  [facility] Medication [facility] [address] [place] [place], [place] [address] [phone] --Must be a resident of [facility] -- Must have NO insurance coverage whatsoever (  no Medicaid/ Medicare, etc.) -- The pt. MUST have a primary care doctor that directs their care regularly and follows them in the community   Med[facility]ist  [phone]) [phone]   [employer]  [phone]) [phone]    Agencies that provide inexpensive medical care: Organization         Address  Phone   Notes  [facility]  [phone]) [phone]   [facility]    [phone]   [facility] [address] [place], [place] [address] [phone]) [phone]   [facility] [address], [place] [phone]) [phone]   Planned Parenthood    [phone]) [phone]   [facility]    [phone](336)  [phone]   [facility]  [address], [place] Phone:  [phone]) [phone], Fax:  [phone]) [phone] Hours of Operation:  9 am - 6 pm, M-F.  Also accepts Medicaid/Medicare and self-pay.  [facility]  [address], [place] Phone: [phone](336) [phone], Fax: [phone](336) [phone]. Hours of Operation:  8:30 am - 5:30 pm, M-F.  Also accepts Medicaid and self-pay.  [facility] [place] [address], [place] Phone: [phone]) [phone]   [facility] [address], [place], [place] [phone], Ext. [phone] Mondays & Thursdays: 7-9 AM.  First 15 patients are seen on a first come, first serve basis.    Medicaid-accepting [facility] Providers:  Organization         Address  Phone   Notes  [facility] [address], [place] [phone] Also accepts self-pay patients.  [facility] [address] [place], [place]  [phone]) [phone]   [facility] [address], [place] [phone]) [phone]   [facility] [address] [place] [address], [place] [phone]) [phone]   Renaye RakersVeita Bland [address], [place]   [phone]) [phone] Only accepts [place] Access [place] patients after they have their name applied to their card.   Self-Pay (no insurance) in [place] [facility]:  Organization         Address  Phone   Notes  Sickle Cell Patients, [facility] [address], [place] [phone]) [phone]   [facility] [address], [place] [phone]) [phone]   [facility] [place]  [address], [place] [phone]) [phone]   [facility]/Dr. Osei-Bonsu  [address] [place] [address], [place] or [phone], [place] [phone] Phone number for both [place] and [place] locations is the same.  [facility] [address], [place] [phone])  [phone]   [facility] [address] [place] [address], [place] or [address] [phone]) [phone]   [facility] [address], [place] [phone], phone; [phone]) [phone], fax Sees patients 1st and 3rd Saturday of every month.  Must not qualify for public or private insurance (i.e. Medicaid, Medicare, [place] Health Choice, Veterans' Benefits)  Household income should be no more than 200% of the poverty level The clinic cannot treat you if you are pregnant or think you are pregnant  Sexually transmitted diseases are not treated at the clinic.    Dental Care: Organization         Address  Phone  Notes  [facility]  [facility] [address] [place], [place] [phone] Accepts children up to age 83 who are enrolled in [place] or [place] Health Choice; pregnant women with a Medicaid card; and children who have applied for Medicaid or [place] Health Choice, but were declined, whose parents can pay a reduced fee at time of service.  [facility] Department of Outpatient Surgical Specialties Center  [address], [place] [phone] Accepts children up to age 49 who are enrolled in [place] or [place] Health Choice; pregnant women with a Medicaid card; and children who have applied for Medicaid or [place] Health Choice, but were declined, whose parents can pay a reduced fee at time of service.  [facility]  [address] [place], [place] [phone] Patients are seen by appointment only. Walk-ins are not accepted. [facility] will see patients 54 years of age and older. Monday - Tuesday (8am-5pm) Most Wednesdays (8:30-5pm) $30 per visit, cash only  [facility] Adult Dental Access PROGRAM  [address], [facility] [phone] Patients are seen by appointment only. Walk-ins are not accepted. [facility] will see patients 25 years of age and older. One Wednesday Evening (Monthly: Volunteer  Based).  $30 per visit, cash only  [employer]  [phone] for adults; Children under age 77, call Graduate Pediatric Dentistry at [phone]. Children aged 23-14, please call [phone] to request a pediatric application.  Dental services are provided in all areas of dental care including fillings, crowns and bridges, complete and partial dentures, implants, gum treatment, root canals, and extractions. Preventive care is also provided. Treatment is provided to both adults and children. Patients are selected via a lottery and there is often a waiting list.   [facility] [address], [place]  [phone] [email or web address]   [facility] [address] [place], [place] [phone], Ext. [phone] Second and Fourth Thursday of each month, opens at 6:30 AM; Clinic ends at 9 AM.  Patients are seen on a first-come first-served basis, and a limited number are seen during each clinic.   [facility]  [address] [place], [place] [phone]   Eligibility Requirements You must have lived in [place], [place], or [place] counties for at least the last three months.   You cannot be eligible for state or federal sponsored [place], including [employer], [place], or [employer].   You generally cannot be eligible for healthcare insurance through your employer.    How to apply: Eligibility screenings are held every Tuesday and Wednesday afternoon from 1:00 pm until 4:00 pm. You do not need an appointment for the interview!  [facility] [address] [place], [place] [phone]   [facility] Health Department  [phone]   [facility] Health Department  [phone]   [facility] Health Department  [phone]    Behavioral Health Resources in the Community: Intensive Outpatient Programs Organization         Address  Phone  Notes  [facility]  Services [address] [place], [place] [phone]   [facility] Outpatient [address] [place], [place] [phone]   [facility] [address], [place], [place]  [phone]   [facility] Mental Health [address],  [place], [place] [phone] or [phone]   Substance Abuse Resources Organization         Address  Phone  Notes  Alcohol and Drug Services  [phone]   [facility]  [phone]   [facility]  [phone]   Floydene Flock  [phone]   Residential & Outpatient Substance Abuse Program  [phone]   Psychological Services Organization         Address  Phone  Notes  [facility] Behavioral Health  [phone]   [facility] Services  [phone]   [facility] Mental Health [address], [place] [phone] or [phone]    Mobile Crisis Teams Organization         Address  Phone  Notes  [facility], Mobile Crisis Care Unit  [phone]   [facility]  [address] [place], [place] [phone]   Doristine Locks [address] [place] [phone]    Self-Help/Support Groups Organization         Address  Phone             Notes  [facility] [place] - variety of support groups  336- I7437963 Call for more information  Narcotics Anonymous (NA), [facility] [address], [employer] [place]  2 meetings at this location   Statistician         Address  Phone  Notes  [facility] [address],    [place]  [phone]   [facility]  [address], [place] [address], [place], [place] [phone]   [facility] [address] [place], [place] [phone] Admissions: 8am-3pm M-F  [facility] [address],    [place], [place] [phone]   [facility] [address] [place], [place], [place] [phone]    [facility] [address]  [place], [place] [phone]   [facility] - Intensive Outpatient [address], [place], [place] [phone]   [facility] (Addiction Recovery [facility] [address] [place]  [place], [place] [phone] or [phone]   [facility] ([facility]) [address] [place], [place] [phone] Accepts Medicaid  [facility] [address],  [place] [phone] Substance Abuse/Addiction Treatment   [facility] Organization         Address  Phone  Notes  [facility]  [phone]   Angie Fava, PhD [address] Kn[address] [place], [place]   [phone] or [phone]   [facility] Behavioral   [address] [place], [place] [phone]   [facility] [address] [place], [place] [phone] Insurance/Medicaid/sponsorship through [facility] and Families [address]                                    [place], [place] [phone] Therapy/tele-psych/case  [facility] [address], [place] [phone]    Dr. Lolly Mustache  [phone]   [facility]  [employer] [facility] Dept. 1) [address] [place] 2) [address], [place] 3)  [address], [place] [phone]  [phone]   [facility] Child Abuse Hotline 201-607-4482 or [phone] (After Hours)

## 2013-10-28 NOTE — ED Notes (Signed)
Pt reports yeast infection, takes metformin for diabetes, but has not checked sugar in a while

## 2013-10-28 NOTE — ED Notes (Signed)
Unable to give a urine sample at this time

## 2013-10-28 NOTE — ED Notes (Signed)
Pt has history of colon resection, diverticulitis.  Pt now is here with all over abdominal pain and reports bloating of abdomen/tight.  Increased gas and diarrhea.  Pt reports exhaustion.  Pt denies chest pain or sob

## 2013-10-28 NOTE — ED Provider Notes (Signed)
Patient care assumed from Cherrie DistanceFrances Sanford, PA-C at shift change with CT pending. Patient presenting today with abdominal pain; she has a hx of diverticulitis s/p colonic resection as well as kidney stones.  CT today without any acute intraabdominal findings. Patient states the pain was well controlled with Dilaudid and ED. She will be given Percocet for additional pain control prior to discharge. Patient given referral to women's outpatient clinic for further evaluation of her symptoms and menstrual irregularities. Have also advised the patient followup with her gastroenterologist. Return precautions discussed and patient agreeable to plan with no unaddressed concerns. She is hemodynamically stable and appropriate for discharge.   Results for orders placed during the hospital encounter of 10/28/13  CBC WITH DIFFERENTIAL      Result Value Range   WBC 9.4  4.0 - 10.5 K/uL   RBC 4.45  3.87 - 5.11 MIL/uL   Hemoglobin 14.3  12.0 - 15.0 g/dL   HCT 16.139.5  09.636.0 - 04.546.0 %   MCV 88.8  78.0 - 100.0 fL   MCH 32.1  26.0 - 34.0 pg   MCHC 36.2 (*) 30.0 - 36.0 g/dL   RDW 40.912.8  81.111.5 - 91.415.5 %   Platelets 252  150 - 400 K/uL   Neutrophils Relative % 64  43 - 77 %   Neutro Abs 6.0  1.7 - 7.7 K/uL   Lymphocytes Relative 32  12 - 46 %   Lymphs Abs 3.0  0.7 - 4.0 K/uL   Monocytes Relative 3  3 - 12 %   Monocytes Absolute 0.3  0.1 - 1.0 K/uL   Eosinophils Relative 1  0 - 5 %   Eosinophils Absolute 0.1  0.0 - 0.7 K/uL   Basophils Relative 0  0 - 1 %   Basophils Absolute 0.0  0.0 - 0.1 K/uL  COMPREHENSIVE METABOLIC PANEL      Result Value Range   Sodium 135 (*) 137 - 147 mEq/L   Potassium 4.5  3.7 - 5.3 mEq/L   Chloride 96  96 - 112 mEq/L   CO2 24  19 - 32 mEq/L   Glucose, Bld 307 (*) 70 - 99 mg/dL   BUN 13  6 - 23 mg/dL   Creatinine, Ser 7.820.86  0.50 - 1.10 mg/dL   Calcium 9.0  8.4 - 95.610.5 mg/dL   Total Protein 7.8  6.0 - 8.3 g/dL   Albumin 3.4 (*) 3.5 - 5.2 g/dL   AST 44 (*) 0 - 37 U/L   ALT 41 (*) 0 - 35  U/L   Alkaline Phosphatase 96  39 - 117 U/L   Total Bilirubin 0.2 (*) 0.3 - 1.2 mg/dL   GFR calc non Af Amer 85 (*) >90 mL/min   GFR calc Af Amer >90  >90 mL/min  LIPASE, BLOOD      Result Value Range   Lipase 24  11 - 59 U/L  URINALYSIS, ROUTINE W REFLEX MICROSCOPIC      Result Value Range   Color, Urine YELLOW  YELLOW   APPearance CLOUDY (*) CLEAR   Specific Gravity, Urine 1.025  1.005 - 1.030   pH 5.0  5.0 - 8.0   Glucose, UA 500 (*) NEGATIVE mg/dL   Hgb urine dipstick NEGATIVE  NEGATIVE   Bilirubin Urine NEGATIVE  NEGATIVE   Ketones, ur NEGATIVE  NEGATIVE mg/dL   Protein, ur NEGATIVE  NEGATIVE mg/dL   Urobilinogen, UA 0.2  0.0 - 1.0 mg/dL   Nitrite NEGATIVE  NEGATIVE  Leukocytes, UA NEGATIVE  NEGATIVE  POCT I-STAT TROPONIN I      Result Value Range   Troponin i, poc 0.00  0.00 - 0.08 ng/mL   Comment 3           POCT PREGNANCY, URINE      Result Value Range   Preg Test, Ur NEGATIVE  NEGATIVE   Ct Abdomen Pelvis W Contrast  10/28/2013   CLINICAL DATA:  Abdominal pain  EXAM: CT ABDOMEN AND PELVIS WITH CONTRAST  TECHNIQUE: Multidetector CT imaging of the abdomen and pelvis was performed using the standard protocol following bolus administration of intravenous contrast.  CONTRAST:  OMNIPAQUE IOHEXOL 300 MG/ML  SOLN  COMPARISON:  None.  FINDINGS: BODY WALL: Anterior abdominal wall laxity with small anterior fatty hernias.  LOWER CHEST: Unremarkable.  ABDOMEN/PELVIS:  Liver: Marked fatty infiltration of the liver. Hepatomegaly with a 20 cm craniocaudal span.  Biliary: No evidence of biliary obstruction or stone.  Pancreas: Unremarkable.  Spleen: Unremarkable.  Adrenals: Unremarkable.  Kidneys and ureters: 4 mm nonobstructive stone in the lower pole right kidney. No hydronephrosis.  Bladder: Unremarkable given decompressed state.  Reproductive: Unremarkable.  Bowel: No bowel obstruction. There is been previous sigmoid or descending colonic resection with anastomosis. The sigmoid  colon appears circumferentially thickened, but is decompressed. There is no surrounding inflammatory changes to confirm colitis. No inflamed diverticula. Normal appendix.  Retroperitoneum: No mass or adenopathy.  Peritoneum: No free fluid or gas.  Vascular: No acute abnormality.  OSSEOUS: No acute abnormalities.  IMPRESSION: 1. No acute intra-abdominal findings. 2. Marked hepatic steatosis with hepatomegaly. 3. Nonobstructive right nephrolithiasis.   Electronically Signed   By: Tiburcio Pea M.D.   On: 10/28/2013 21:40      Antony Madura, PA-C 10/28/13 [id number]

## 2013-10-28 NOTE — ED Provider Notes (Signed)
Medical screening examination/treatment/procedure(s) were performed by non-physician practitioner and as supervising physician I was immediately available for consultation/collaboration.  EKG Interpretation   None         Joya Gaskinsonald W Zohal Reny, MD 10/28/13 2327

## 2013-10-29 NOTE — ED Provider Notes (Signed)
Medical screening examination/treatment/procedure(s) were performed by non-physician practitioner and as supervising physician I was immediately available for consultation/collaboration.  EKG Interpretation   None       Devoria AlbeIva Yulonda Wheeling, MD, Armando GangFACEP   Ward GivensIva L Forestine Macho, MD 10/29/13 1500

## 2013-11-21 ENCOUNTER — Ambulatory Visit: Payer: Self-pay | Admitting: Family Medicine

## 2013-12-03 ENCOUNTER — Emergency Department: Payer: Self-pay | Admitting: Internal Medicine

## 2013-12-03 LAB — URINALYSIS, COMPLETE
Bacteria: NONE SEEN
Bilirubin,UR: NEGATIVE
Glucose,UR: >=500 mg/dL (ref 0–75)
Ketone: NEGATIVE
LEUKOCYTE ESTERASE: NEGATIVE
Nitrite: NEGATIVE
PH: 5 (ref 4.5–8.0)
Protein: NEGATIVE
Specific Gravity: 1.022 (ref 1.003–1.030)

## 2013-12-03 LAB — CBC WITH DIFFERENTIAL/PLATELET
BASOS ABS: 0.1 10*3/uL (ref 0.0–0.1)
BASOS PCT: 0.6 %
EOS ABS: 0.1 10*3/uL (ref 0.0–0.7)
Eosinophil %: 0.5 %
HCT: 40.2 % (ref 35.0–47.0)
HGB: 14.1 g/dL (ref 12.0–16.0)
Lymphocyte #: 3.5 10*3/uL (ref 1.0–3.6)
Lymphocyte %: 33.2 %
MCH: 32.6 pg (ref 26.0–34.0)
MCHC: 35 g/dL (ref 32.0–36.0)
MCV: 93 fL (ref 80–100)
MONO ABS: 0.6 x10 3/mm (ref 0.2–0.9)
MONOS PCT: 5.3 %
Neutrophil #: 6.3 10*3/uL (ref 1.4–6.5)
Neutrophil %: 60.4 %
PLATELETS: 242 10*3/uL (ref 150–440)
RBC: 4.32 10*6/uL (ref 3.80–5.20)
RDW: 13.8 % (ref 11.5–14.5)
WBC: 10.4 10*3/uL (ref 3.6–11.0)

## 2013-12-03 LAB — COMPREHENSIVE METABOLIC PANEL
Albumin: 3.4 g/dL (ref 3.4–5.0)
Alkaline Phosphatase: 101 U/L
Anion Gap: 5 — ABNORMAL LOW (ref 7–16)
BILIRUBIN TOTAL: 0.2 mg/dL (ref 0.2–1.0)
BUN: 9 mg/dL (ref 7–18)
CALCIUM: 9.4 mg/dL (ref 8.5–10.1)
CHLORIDE: 99 mmol/L (ref 98–107)
CO2: 27 mmol/L (ref 21–32)
CREATININE: 0.9 mg/dL (ref 0.60–1.30)
EGFR (African American): >60
EGFR (Non-African Amer.): >60
GLUCOSE: 317 mg/dL — AB (ref 65–99)
Osmolality: 273 (ref 275–301)
POTASSIUM: 3.8 mmol/L (ref 3.5–5.1)
SGOT(AST): 27 U/L (ref 15–37)
SGPT (ALT): 35 U/L (ref 12–78)
Sodium: 131 mmol/L — ABNORMAL LOW (ref 136–145)
TOTAL PROTEIN: 8.2 g/dL (ref 6.4–8.2)

## 2013-12-03 LAB — TROPONIN I

## 2013-12-03 LAB — LIPASE, BLOOD: LIPASE: 128 U/L (ref 73–393)

## 2013-12-10 ENCOUNTER — Emergency Department: Payer: Self-pay | Admitting: Emergency Medicine

## 2013-12-10 LAB — COMPREHENSIVE METABOLIC PANEL
ALBUMIN: 3.6 g/dL (ref 3.4–5.0)
ALK PHOS: 100 U/L
ALT: 39 U/L (ref 12–78)
Anion Gap: 5 — ABNORMAL LOW (ref 7–16)
BUN: 10 mg/dL (ref 7–18)
Bilirubin,Total: 0.2 mg/dL (ref 0.2–1.0)
CALCIUM: 9 mg/dL (ref 8.5–10.1)
Chloride: 99 mmol/L (ref 98–107)
Co2: 28 mmol/L (ref 21–32)
Creatinine: 0.91 mg/dL (ref 0.60–1.30)
EGFR (African American): >60
EGFR (Non-African Amer.): >60
Glucose: 372 mg/dL — ABNORMAL HIGH (ref 65–99)
Osmolality: 279 (ref 275–301)
Potassium: 3.9 mmol/L (ref 3.5–5.1)
SGOT(AST): 24 U/L (ref 15–37)
SODIUM: 132 mmol/L — AB (ref 136–145)
TOTAL PROTEIN: 8.5 g/dL — AB (ref 6.4–8.2)

## 2013-12-10 LAB — URINALYSIS, COMPLETE
BILIRUBIN, UR: NEGATIVE
BLOOD: NEGATIVE
Bacteria: NONE SEEN
Glucose,UR: >=500 mg/dL (ref 0–75)
Ketone: NEGATIVE
Leukocyte Esterase: NEGATIVE
NITRITE: NEGATIVE
PH: 5 (ref 4.5–8.0)
Protein: NEGATIVE
SPECIFIC GRAVITY: 1.024 (ref 1.003–1.030)
Squamous Epithelial: 1
WBC UR: <1 /HPF (ref 0–5)

## 2013-12-10 LAB — CBC WITH DIFFERENTIAL/PLATELET
BASOS PCT: 0.7 %
Basophil #: 0.1 10*3/uL (ref 0.0–0.1)
Eosinophil #: 0.1 10*3/uL (ref 0.0–0.7)
Eosinophil %: 0.8 %
HCT: 43.6 % (ref 35.0–47.0)
HGB: 14.6 g/dL (ref 12.0–16.0)
LYMPHS ABS: 3.7 10*3/uL — AB (ref 1.0–3.6)
Lymphocyte %: 40.5 %
MCH: 31.4 pg (ref 26.0–34.0)
MCHC: 33.5 g/dL (ref 32.0–36.0)
MCV: 94 fL (ref 80–100)
Monocyte #: 0.7 x10 3/mm (ref 0.2–0.9)
Monocyte %: 7.9 %
NEUTROS ABS: 4.6 10*3/uL (ref 1.4–6.5)
Neutrophil %: 50.1 %
Platelet: 264 10*3/uL (ref 150–440)
RBC: 4.66 10*6/uL (ref 3.80–5.20)
RDW: 13.6 % (ref 11.5–14.5)
WBC: 9.2 10*3/uL (ref 3.6–11.0)

## 2013-12-10 LAB — PREGNANCY, URINE: Pregnancy Test, Urine: NEGATIVE m[IU]/mL

## 2013-12-10 LAB — LIPASE, BLOOD: LIPASE: 221 U/L (ref 73–393)

## 2014-01-02 ENCOUNTER — Emergency Department: Payer: Self-pay | Admitting: Emergency Medicine

## 2014-01-02 LAB — URINALYSIS, COMPLETE
Bacteria: NONE SEEN
Bilirubin,UR: NEGATIVE
Blood: NEGATIVE
Ketone: NEGATIVE
Leukocyte Esterase: NEGATIVE
NITRITE: NEGATIVE
PROTEIN: NEGATIVE
Ph: 5 (ref 4.5–8.0)
SPECIFIC GRAVITY: 1.023 (ref 1.003–1.030)
WBC UR: 1 /HPF (ref 0–5)

## 2014-01-02 LAB — COMPREHENSIVE METABOLIC PANEL
ALK PHOS: 99 U/L
AST: 30 U/L (ref 15–37)
Albumin: 3.8 g/dL (ref 3.4–5.0)
Anion Gap: 2 — ABNORMAL LOW (ref 7–16)
BILIRUBIN TOTAL: 0.3 mg/dL (ref 0.2–1.0)
BUN: 11 mg/dL (ref 7–18)
CALCIUM: 9 mg/dL (ref 8.5–10.1)
CO2: 31 mmol/L (ref 21–32)
CREATININE: 0.84 mg/dL (ref 0.60–1.30)
Chloride: 99 mmol/L (ref 98–107)
Glucose: 227 mg/dL — ABNORMAL HIGH (ref 65–99)
Osmolality: 271 (ref 275–301)
Potassium: 3.8 mmol/L (ref 3.5–5.1)
SGPT (ALT): 41 U/L (ref 12–78)
SODIUM: 132 mmol/L — AB (ref 136–145)
Total Protein: 8.3 g/dL — ABNORMAL HIGH (ref 6.4–8.2)

## 2014-01-02 LAB — CBC
HCT: 40 % (ref 35.0–47.0)
HGB: 13.8 g/dL (ref 12.0–16.0)
MCH: 32.1 pg (ref 26.0–34.0)
MCHC: 34.6 g/dL (ref 32.0–36.0)
MCV: 93 fL (ref 80–100)
PLATELETS: 240 10*3/uL (ref 150–440)
RBC: 4.32 10*6/uL (ref 3.80–5.20)
RDW: 13.7 % (ref 11.5–14.5)
WBC: 14.1 10*3/uL — AB (ref 3.6–11.0)

## 2014-01-02 LAB — LIPASE, BLOOD: Lipase: 107 U/L (ref 73–393)

## 2014-01-02 LAB — GC/CHLAMYDIA PROBE AMP

## 2014-01-02 LAB — WET PREP, GENITAL

## 2014-01-05 ENCOUNTER — Observation Stay: Payer: Self-pay | Admitting: Internal Medicine

## 2014-01-05 LAB — COMPREHENSIVE METABOLIC PANEL
ALK PHOS: 87 U/L
ANION GAP: 4 — AB (ref 7–16)
AST: 32 U/L (ref 15–37)
Albumin: 3.3 g/dL — ABNORMAL LOW (ref 3.4–5.0)
BUN: 8 mg/dL (ref 7–18)
Bilirubin,Total: 0.2 mg/dL (ref 0.2–1.0)
CALCIUM: 8.6 mg/dL (ref 8.5–10.1)
CREATININE: 0.89 mg/dL (ref 0.60–1.30)
Chloride: 99 mmol/L (ref 98–107)
Co2: 29 mmol/L (ref 21–32)
EGFR (African American): >60
Glucose: 278 mg/dL — ABNORMAL HIGH (ref 65–99)
OSMOLALITY: 273 (ref 275–301)
POTASSIUM: 4 mmol/L (ref 3.5–5.1)
SGPT (ALT): 38 U/L (ref 12–78)
SODIUM: 132 mmol/L — AB (ref 136–145)
TOTAL PROTEIN: 7.6 g/dL (ref 6.4–8.2)

## 2014-01-05 LAB — CBC WITH DIFFERENTIAL/PLATELET
BASOS ABS: 0.1 10*3/uL (ref 0.0–0.1)
BASOS PCT: 1.3 %
EOS ABS: 0.1 10*3/uL (ref 0.0–0.7)
Eosinophil %: 0.9 %
HCT: 38.1 % (ref 35.0–47.0)
HGB: 13.3 g/dL (ref 12.0–16.0)
LYMPHS ABS: 2.6 10*3/uL (ref 1.0–3.6)
Lymphocyte %: 30.3 %
MCH: 32.9 pg (ref 26.0–34.0)
MCHC: 35 g/dL (ref 32.0–36.0)
MCV: 94 fL (ref 80–100)
MONO ABS: 0.5 x10 3/mm (ref 0.2–0.9)
MONOS PCT: 5.4 %
Neutrophil #: 5.3 10*3/uL (ref 1.4–6.5)
Neutrophil %: 62.1 %
Platelet: 234 10*3/uL (ref 150–440)
RBC: 4.06 10*6/uL (ref 3.80–5.20)
RDW: 13.4 % (ref 11.5–14.5)
WBC: 8.5 10*3/uL (ref 3.6–11.0)

## 2014-01-05 LAB — HEMOGLOBIN A1C: HEMOGLOBIN A1C: 10.9 % — AB (ref 4.2–6.3)

## 2014-01-05 LAB — URINALYSIS, COMPLETE
BACTERIA: NONE SEEN
Bilirubin,UR: NEGATIVE
Blood: NEGATIVE
Glucose,UR: >=500 mg/dL (ref 0–75)
Ketone: NEGATIVE
Leukocyte Esterase: NEGATIVE
Nitrite: NEGATIVE
Ph: 6 (ref 4.5–8.0)
Protein: NEGATIVE
RBC,UR: 2 /HPF (ref 0–5)
SPECIFIC GRAVITY: 1.022 (ref 1.003–1.030)
Squamous Epithelial: 6
WBC UR: 1 /HPF (ref 0–5)

## 2014-01-06 LAB — CBC WITH DIFFERENTIAL/PLATELET
BASOS ABS: 0 10*3/uL (ref 0.0–0.1)
Basophil %: 0.6 %
Eosinophil #: 0.1 10*3/uL (ref 0.0–0.7)
Eosinophil %: 1.2 %
HCT: 37.5 % (ref 35.0–47.0)
HGB: 12.7 g/dL (ref 12.0–16.0)
Lymphocyte #: 2.7 10*3/uL (ref 1.0–3.6)
Lymphocyte %: 33.2 %
MCH: 31.9 pg (ref 26.0–34.0)
MCHC: 33.8 g/dL (ref 32.0–36.0)
MCV: 94 fL (ref 80–100)
Monocyte #: 0.5 x10 3/mm (ref 0.2–0.9)
Monocyte %: 5.6 %
NEUTROS ABS: 4.9 10*3/uL (ref 1.4–6.5)
NEUTROS PCT: 59.4 %
PLATELETS: 239 10*3/uL (ref 150–440)
RBC: 3.97 10*6/uL (ref 3.80–5.20)
RDW: 13.6 % (ref 11.5–14.5)
WBC: 8.2 10*3/uL (ref 3.6–11.0)

## 2014-01-06 LAB — BASIC METABOLIC PANEL
ANION GAP: 0 — AB (ref 7–16)
BUN: 10 mg/dL (ref 7–18)
CO2: 34 mmol/L — AB (ref 21–32)
CREATININE: 0.96 mg/dL (ref 0.60–1.30)
Calcium, Total: 8.4 mg/dL — ABNORMAL LOW (ref 8.5–10.1)
Chloride: 100 mmol/L (ref 98–107)
EGFR (Non-African Amer.): >60
Glucose: 253 mg/dL — ABNORMAL HIGH (ref 65–99)
OSMOLALITY: 276 (ref 275–301)
POTASSIUM: 4.4 mmol/L (ref 3.5–5.1)
SODIUM: 134 mmol/L — AB (ref 136–145)

## 2014-01-07 LAB — BASIC METABOLIC PANEL
Anion Gap: 2 — ABNORMAL LOW (ref 7–16)
BUN: 7 mg/dL (ref 7–18)
CO2: 31 mmol/L (ref 21–32)
Calcium, Total: 7.8 mg/dL — ABNORMAL LOW (ref 8.5–10.1)
Chloride: 102 mmol/L (ref 98–107)
Creatinine: 0.83 mg/dL (ref 0.60–1.30)
EGFR (African American): >60
EGFR (Non-African Amer.): >60
GLUCOSE: 181 mg/dL — AB (ref 65–99)
Osmolality: 273 (ref 275–301)
Potassium: 3.5 mmol/L (ref 3.5–5.1)
SODIUM: 135 mmol/L — AB (ref 136–145)

## 2014-01-07 LAB — CBC WITH DIFFERENTIAL/PLATELET
Basophil #: 0 10*3/uL (ref 0.0–0.1)
Basophil %: 0.5 %
EOS ABS: 0.1 10*3/uL (ref 0.0–0.7)
Eosinophil %: 1.1 %
HCT: 39.1 % (ref 35.0–47.0)
HGB: 13.4 g/dL (ref 12.0–16.0)
Lymphocyte #: 2.7 10*3/uL (ref 1.0–3.6)
Lymphocyte %: 32.6 %
MCH: 32.5 pg (ref 26.0–34.0)
MCHC: 34.1 g/dL (ref 32.0–36.0)
MCV: 95 fL (ref 80–100)
MONO ABS: 0.5 x10 3/mm (ref 0.2–0.9)
Monocyte %: 5.6 %
NEUTROS ABS: 5 10*3/uL (ref 1.4–6.5)
Neutrophil %: 60.2 %
Platelet: 223 10*3/uL (ref 150–440)
RBC: 4.11 10*6/uL (ref 3.80–5.20)
RDW: 13.3 % (ref 11.5–14.5)
WBC: 8.2 10*3/uL (ref 3.6–11.0)

## 2014-01-07 LAB — CLOSTRIDIUM DIFFICILE(ARMC)

## 2014-03-24 ENCOUNTER — Emergency Department: Payer: Self-pay | Admitting: Emergency Medicine

## 2014-03-24 LAB — URINALYSIS, COMPLETE
BILIRUBIN, UR: NEGATIVE
Blood: NEGATIVE
GLUCOSE, UR: NEGATIVE mg/dL (ref 0–75)
Ketone: NEGATIVE
LEUKOCYTE ESTERASE: NEGATIVE
Nitrite: NEGATIVE
Ph: 5 (ref 4.5–8.0)
Protein: NEGATIVE
RBC,UR: NONE SEEN /HPF (ref 0–5)
SPECIFIC GRAVITY: 1.025 (ref 1.003–1.030)
WBC UR: 1 /HPF (ref 0–5)

## 2014-03-24 LAB — COMPREHENSIVE METABOLIC PANEL
ALBUMIN: 3.8 g/dL (ref 3.4–5.0)
ANION GAP: 5 — AB (ref 7–16)
Alkaline Phosphatase: 73 U/L
BUN: 13 mg/dL (ref 7–18)
Bilirubin,Total: 0.2 mg/dL (ref 0.2–1.0)
CALCIUM: 9.1 mg/dL (ref 8.5–10.1)
CO2: 30 mmol/L (ref 21–32)
CREATININE: 0.94 mg/dL (ref 0.60–1.30)
Chloride: 103 mmol/L (ref 98–107)
EGFR (African American): >60
Glucose: 166 mg/dL — ABNORMAL HIGH (ref 65–99)
OSMOLALITY: 280 (ref 275–301)
POTASSIUM: 4 mmol/L (ref 3.5–5.1)
SGOT(AST): 28 U/L (ref 15–37)
SGPT (ALT): 36 U/L (ref 12–78)
Sodium: 138 mmol/L (ref 136–145)
TOTAL PROTEIN: 8 g/dL (ref 6.4–8.2)

## 2014-03-24 LAB — CBC
HCT: 42 % (ref 35.0–47.0)
HGB: 14.2 g/dL (ref 12.0–16.0)
MCH: 31.8 pg (ref 26.0–34.0)
MCHC: 33.9 g/dL (ref 32.0–36.0)
MCV: 94 fL (ref 80–100)
Platelet: 232 10*3/uL (ref 150–440)
RBC: 4.48 10*6/uL (ref 3.80–5.20)
RDW: 13.5 % (ref 11.5–14.5)
WBC: 11.4 10*3/uL — ABNORMAL HIGH (ref 3.6–11.0)

## 2014-03-24 LAB — LIPASE, BLOOD: Lipase: 111 U/L (ref 73–393)

## 2014-03-29 ENCOUNTER — Emergency Department: Payer: Self-pay | Admitting: Emergency Medicine

## 2014-03-29 LAB — CBC WITH DIFFERENTIAL/PLATELET
Basophil #: 0.1 10*3/uL (ref 0.0–0.1)
Basophil %: 1 %
EOS ABS: 0.1 10*3/uL (ref 0.0–0.7)
EOS PCT: 0.8 %
HCT: 42.6 % (ref 35.0–47.0)
HGB: 14.2 g/dL (ref 12.0–16.0)
LYMPHS ABS: 2.9 10*3/uL (ref 1.0–3.6)
Lymphocyte %: 25.8 %
MCH: 31.2 pg (ref 26.0–34.0)
MCHC: 33.2 g/dL (ref 32.0–36.0)
MCV: 94 fL (ref 80–100)
Monocyte #: 0.5 x10 3/mm (ref 0.2–0.9)
Monocyte %: 4.6 %
Neutrophil #: 7.5 10*3/uL — ABNORMAL HIGH (ref 1.4–6.5)
Neutrophil %: 67.8 %
PLATELETS: 253 10*3/uL (ref 150–440)
RBC: 4.53 10*6/uL (ref 3.80–5.20)
RDW: 13.6 % (ref 11.5–14.5)
WBC: 11.1 10*3/uL — ABNORMAL HIGH (ref 3.6–11.0)

## 2014-03-29 LAB — COMPREHENSIVE METABOLIC PANEL
ALBUMIN: 3.7 g/dL (ref 3.4–5.0)
ALK PHOS: 72 U/L
ANION GAP: 7 (ref 7–16)
BILIRUBIN TOTAL: 0.4 mg/dL (ref 0.2–1.0)
BUN: 10 mg/dL (ref 7–18)
CHLORIDE: 102 mmol/L (ref 98–107)
CREATININE: 0.92 mg/dL (ref 0.60–1.30)
Calcium, Total: 8.9 mg/dL (ref 8.5–10.1)
Co2: 28 mmol/L (ref 21–32)
EGFR (African American): >60
GLUCOSE: 149 mg/dL — AB (ref 65–99)
OSMOLALITY: 276 (ref 275–301)
Potassium: 4.1 mmol/L (ref 3.5–5.1)
SGOT(AST): 25 U/L (ref 15–37)
SGPT (ALT): 36 U/L (ref 12–78)
Sodium: 137 mmol/L (ref 136–145)
Total Protein: 7.9 g/dL (ref 6.4–8.2)

## 2014-03-29 LAB — LIPASE, BLOOD: LIPASE: 98 U/L (ref 73–393)

## 2014-04-28 ENCOUNTER — Emergency Department: Payer: Self-pay | Admitting: Emergency Medicine

## 2014-04-28 LAB — URINALYSIS, COMPLETE
Bacteria: NONE SEEN
Bilirubin,UR: NEGATIVE
Blood: NEGATIVE
Ketone: NEGATIVE
Leukocyte Esterase: NEGATIVE
NITRITE: NEGATIVE
PH: 6 (ref 4.5–8.0)
Protein: NEGATIVE
SPECIFIC GRAVITY: 1.027 (ref 1.003–1.030)

## 2014-04-28 LAB — CBC WITH DIFFERENTIAL/PLATELET
BASOS PCT: 0.7 %
Basophil #: 0.1 10*3/uL (ref 0.0–0.1)
EOS ABS: 0.1 10*3/uL (ref 0.0–0.7)
Eosinophil %: 0.8 %
HCT: 41.5 % (ref 35.0–47.0)
HGB: 13.7 g/dL (ref 12.0–16.0)
LYMPHS ABS: 3.7 10*3/uL — AB (ref 1.0–3.6)
LYMPHS PCT: 31.2 %
MCH: 31.2 pg (ref 26.0–34.0)
MCHC: 33 g/dL (ref 32.0–36.0)
MCV: 95 fL (ref 80–100)
Monocyte #: 0.5 x10 3/mm (ref 0.2–0.9)
Monocyte %: 4.3 %
Neutrophil #: 7.5 10*3/uL — ABNORMAL HIGH (ref 1.4–6.5)
Neutrophil %: 63 %
Platelet: 263 10*3/uL (ref 150–440)
RBC: 4.38 10*6/uL (ref 3.80–5.20)
RDW: 13.8 % (ref 11.5–14.5)
WBC: 11.9 10*3/uL — ABNORMAL HIGH (ref 3.6–11.0)

## 2014-04-28 LAB — COMPREHENSIVE METABOLIC PANEL
ALT: 32 U/L (ref 12–78)
Albumin: 3.5 g/dL (ref 3.4–5.0)
Alkaline Phosphatase: 73 U/L
Anion Gap: 8 (ref 7–16)
BUN: 13 mg/dL (ref 7–18)
Bilirubin,Total: 0.3 mg/dL (ref 0.2–1.0)
CALCIUM: 9.2 mg/dL (ref 8.5–10.1)
Chloride: 99 mmol/L (ref 98–107)
Co2: 27 mmol/L (ref 21–32)
Creatinine: 1.15 mg/dL (ref 0.60–1.30)
EGFR (Non-African Amer.): 60 — ABNORMAL LOW
GLUCOSE: 311 mg/dL — AB (ref 65–99)
Osmolality: 280 (ref 275–301)
Potassium: 3.8 mmol/L (ref 3.5–5.1)
SGOT(AST): 14 U/L — ABNORMAL LOW (ref 15–37)
Sodium: 134 mmol/L — ABNORMAL LOW (ref 136–145)
Total Protein: 7.9 g/dL (ref 6.4–8.2)

## 2014-04-28 LAB — LIPASE, BLOOD: Lipase: 142 U/L (ref 73–393)

## 2014-04-28 LAB — TROPONIN I

## 2014-05-01 ENCOUNTER — Emergency Department: Payer: Self-pay | Admitting: Emergency Medicine

## 2014-05-01 LAB — COMPREHENSIVE METABOLIC PANEL
Albumin: 3.6 g/dL (ref 3.4–5.0)
Alkaline Phosphatase: 77 U/L
Anion Gap: 8 (ref 7–16)
BILIRUBIN TOTAL: 0.2 mg/dL (ref 0.2–1.0)
BUN: 13 mg/dL (ref 7–18)
CHLORIDE: 99 mmol/L (ref 98–107)
CREATININE: 1.03 mg/dL (ref 0.60–1.30)
Calcium, Total: 9 mg/dL (ref 8.5–10.1)
Co2: 29 mmol/L (ref 21–32)
EGFR (African American): >60
Glucose: 271 mg/dL — ABNORMAL HIGH (ref 65–99)
OSMOLALITY: 282 (ref 275–301)
Potassium: 4.1 mmol/L (ref 3.5–5.1)
SGOT(AST): 16 U/L (ref 15–37)
SGPT (ALT): 37 U/L (ref 12–78)
Sodium: 136 mmol/L (ref 136–145)
TOTAL PROTEIN: 8 g/dL (ref 6.4–8.2)

## 2014-05-01 LAB — CBC WITH DIFFERENTIAL/PLATELET
Basophil #: 0.1 10*3/uL (ref 0.0–0.1)
Basophil %: 0.7 %
Eosinophil #: 0.1 10*3/uL (ref 0.0–0.7)
Eosinophil %: 0.8 %
HCT: 43 % (ref 35.0–47.0)
HGB: 14.3 g/dL (ref 12.0–16.0)
LYMPHS ABS: 3.6 10*3/uL (ref 1.0–3.6)
LYMPHS PCT: 32.3 %
MCH: 31.5 pg (ref 26.0–34.0)
MCHC: 33.2 g/dL (ref 32.0–36.0)
MCV: 95 fL (ref 80–100)
MONO ABS: 0.5 x10 3/mm (ref 0.2–0.9)
Monocyte %: 4.2 %
NEUTROS ABS: 7 10*3/uL — AB (ref 1.4–6.5)
NEUTROS PCT: 62 %
PLATELETS: 256 10*3/uL (ref 150–440)
RBC: 4.54 10*6/uL (ref 3.80–5.20)
RDW: 13.7 % (ref 11.5–14.5)
WBC: 11.2 10*3/uL — AB (ref 3.6–11.0)

## 2014-05-01 LAB — URINALYSIS, COMPLETE
Bilirubin,UR: NEGATIVE
Blood: NEGATIVE
Glucose,UR: >=500 mg/dL (ref 0–75)
LEUKOCYTE ESTERASE: NEGATIVE
Nitrite: NEGATIVE
PROTEIN: NEGATIVE
Ph: 5 (ref 4.5–8.0)
RBC,UR: 2 /HPF (ref 0–5)
Specific Gravity: 1.023 (ref 1.003–1.030)
WBC UR: 3 /HPF (ref 0–5)

## 2014-05-01 LAB — LIPASE, BLOOD: LIPASE: 135 U/L (ref 73–393)

## 2014-05-29 ENCOUNTER — Emergency Department: Payer: Self-pay | Admitting: Emergency Medicine

## 2014-05-29 LAB — URINALYSIS, COMPLETE
Bilirubin,UR: NEGATIVE
Blood: NEGATIVE
Glucose,UR: >=500 mg/dL (ref 0–75)
Ketone: NEGATIVE
Leukocyte Esterase: NEGATIVE
Nitrite: NEGATIVE
PROTEIN: NEGATIVE
Ph: 5 (ref 4.5–8.0)
RBC,UR: 5 /HPF (ref 0–5)
SPECIFIC GRAVITY: 1.02 (ref 1.003–1.030)
Squamous Epithelial: 43

## 2014-05-29 LAB — COMPREHENSIVE METABOLIC PANEL
ALBUMIN: 3.4 g/dL (ref 3.4–5.0)
AST: 18 U/L (ref 15–37)
Alkaline Phosphatase: 76 U/L
Anion Gap: 9 (ref 7–16)
BILIRUBIN TOTAL: 0.3 mg/dL (ref 0.2–1.0)
BUN: 14 mg/dL (ref 7–18)
CALCIUM: 9 mg/dL (ref 8.5–10.1)
Chloride: 99 mmol/L (ref 98–107)
Co2: 27 mmol/L (ref 21–32)
Creatinine: 1.09 mg/dL (ref 0.60–1.30)
EGFR (African American): >60
EGFR (Non-African Amer.): >60
Glucose: 293 mg/dL — ABNORMAL HIGH (ref 65–99)
OSMOLALITY: 281 (ref 275–301)
POTASSIUM: 3.9 mmol/L (ref 3.5–5.1)
SGPT (ALT): 40 U/L
Sodium: 135 mmol/L — ABNORMAL LOW (ref 136–145)
Total Protein: 7.7 g/dL (ref 6.4–8.2)

## 2014-05-29 LAB — CBC WITH DIFFERENTIAL/PLATELET
Basophil #: 0.2 10*3/uL — ABNORMAL HIGH (ref 0.0–0.1)
Basophil %: 1.4 %
EOS PCT: 0.6 %
Eosinophil #: 0.1 10*3/uL (ref 0.0–0.7)
HCT: 42.6 % (ref 35.0–47.0)
HGB: 14.2 g/dL (ref 12.0–16.0)
LYMPHS ABS: 3.4 10*3/uL (ref 1.0–3.6)
LYMPHS PCT: 26.6 %
MCH: 31.9 pg (ref 26.0–34.0)
MCHC: 33.4 g/dL (ref 32.0–36.0)
MCV: 96 fL (ref 80–100)
MONO ABS: 0.5 x10 3/mm (ref 0.2–0.9)
Monocyte %: 3.8 %
Neutrophil #: 8.6 10*3/uL — ABNORMAL HIGH (ref 1.4–6.5)
Neutrophil %: 67.6 %
PLATELETS: 282 10*3/uL (ref 150–440)
RBC: 4.45 10*6/uL (ref 3.80–5.20)
RDW: 13.4 % (ref 11.5–14.5)
WBC: 12.7 10*3/uL — ABNORMAL HIGH (ref 3.6–11.0)

## 2014-07-14 ENCOUNTER — Emergency Department: Payer: Self-pay | Admitting: Emergency Medicine

## 2014-07-14 LAB — COMPREHENSIVE METABOLIC PANEL
ALT: 39 U/L
AST: 16 U/L (ref 15–37)
Albumin: 3.5 g/dL (ref 3.4–5.0)
Alkaline Phosphatase: 88 U/L
Anion Gap: 8 (ref 7–16)
BUN: 14 mg/dL (ref 7–18)
Bilirubin,Total: 0.2 mg/dL (ref 0.2–1.0)
Calcium, Total: 8.9 mg/dL (ref 8.5–10.1)
Chloride: 101 mmol/L (ref 98–107)
Co2: 25 mmol/L (ref 21–32)
Creatinine: 0.94 mg/dL (ref 0.60–1.30)
EGFR (African American): >60
GLUCOSE: 265 mg/dL — AB (ref 65–99)
Osmolality: 278 (ref 275–301)
Potassium: 4.1 mmol/L (ref 3.5–5.1)
Sodium: 134 mmol/L — ABNORMAL LOW (ref 136–145)
Total Protein: 8.1 g/dL (ref 6.4–8.2)

## 2014-07-14 LAB — URINALYSIS, COMPLETE
Bacteria: NONE SEEN
Bilirubin,UR: NEGATIVE
Ketone: NEGATIVE
LEUKOCYTE ESTERASE: NEGATIVE
Nitrite: NEGATIVE
Ph: 5 (ref 4.5–8.0)
Protein: NEGATIVE
Specific Gravity: 1.02 (ref 1.003–1.030)
WBC UR: 1 /HPF (ref 0–5)

## 2014-07-14 LAB — CBC WITH DIFFERENTIAL/PLATELET
Basophil #: 0.2 10*3/uL — ABNORMAL HIGH (ref 0.0–0.1)
Basophil %: 1.1 %
Eosinophil #: 0.1 10*3/uL (ref 0.0–0.7)
Eosinophil %: 0.6 %
HCT: 42.6 % (ref 35.0–47.0)
HGB: 14.3 g/dL (ref 12.0–16.0)
LYMPHS PCT: 34.6 %
Lymphocyte #: 4.6 10*3/uL — ABNORMAL HIGH (ref 1.0–3.6)
MCH: 32 pg (ref 26.0–34.0)
MCHC: 33.6 g/dL (ref 32.0–36.0)
MCV: 95 fL (ref 80–100)
Monocyte #: 0.7 x10 3/mm (ref 0.2–0.9)
Monocyte %: 5.3 %
NEUTROS PCT: 58.4 %
Neutrophil #: 7.8 10*3/uL — ABNORMAL HIGH (ref 1.4–6.5)
PLATELETS: 268 10*3/uL (ref 150–440)
RBC: 4.48 10*6/uL (ref 3.80–5.20)
RDW: 12.9 % (ref 11.5–14.5)
WBC: 13.4 10*3/uL — AB (ref 3.6–11.0)

## 2014-07-14 LAB — LIPASE, BLOOD: Lipase: 164 U/L (ref 73–393)

## 2014-07-17 ENCOUNTER — Emergency Department: Payer: Self-pay | Admitting: Emergency Medicine

## 2014-07-17 LAB — COMPREHENSIVE METABOLIC PANEL
ALT: 43 U/L
ANION GAP: 6 — AB (ref 7–16)
Albumin: 3.2 g/dL — ABNORMAL LOW (ref 3.4–5.0)
Alkaline Phosphatase: 85 U/L
BUN: 12 mg/dL (ref 7–18)
Bilirubin,Total: 0.1 mg/dL — ABNORMAL LOW (ref 0.2–1.0)
CALCIUM: 8.4 mg/dL — AB (ref 8.5–10.1)
CHLORIDE: 102 mmol/L (ref 98–107)
CO2: 29 mmol/L (ref 21–32)
CREATININE: 1.09 mg/dL (ref 0.60–1.30)
GFR CALC NON AF AMER: 59 — AB
Glucose: 266 mg/dL — ABNORMAL HIGH (ref 65–99)
Osmolality: 283 (ref 275–301)
Potassium: 4.2 mmol/L (ref 3.5–5.1)
SGOT(AST): 25 U/L (ref 15–37)
SODIUM: 137 mmol/L (ref 136–145)
TOTAL PROTEIN: 7.6 g/dL (ref 6.4–8.2)

## 2014-07-17 LAB — CBC
HCT: 39.8 % (ref 35.0–47.0)
HGB: 13.5 g/dL (ref 12.0–16.0)
MCH: 32.3 pg (ref 26.0–34.0)
MCHC: 34 g/dL (ref 32.0–36.0)
MCV: 95 fL (ref 80–100)
PLATELETS: 233 10*3/uL (ref 150–440)
RBC: 4.19 10*6/uL (ref 3.80–5.20)
RDW: 13.1 % (ref 11.5–14.5)
WBC: 9.8 10*3/uL (ref 3.6–11.0)

## 2014-07-17 LAB — URINALYSIS, COMPLETE
Bacteria: NONE SEEN
Bilirubin,UR: NEGATIVE
Glucose,UR: >=500 mg/dL (ref 0–75)
Ketone: NEGATIVE
Leukocyte Esterase: NEGATIVE
Nitrite: NEGATIVE
Ph: 6 (ref 4.5–8.0)
Protein: NEGATIVE
RBC,UR: 7 /HPF (ref 0–5)
Specific Gravity: 1.021 (ref 1.003–1.030)
Squamous Epithelial: 5

## 2014-07-17 LAB — LIPASE, BLOOD: Lipase: 114 U/L (ref 73–393)

## 2014-09-06 ENCOUNTER — Emergency Department: Payer: Self-pay | Admitting: Emergency Medicine

## 2014-09-06 LAB — COMPREHENSIVE METABOLIC PANEL
ALT: 43 U/L
Albumin: 3.3 g/dL — ABNORMAL LOW (ref 3.4–5.0)
Alkaline Phosphatase: 108 U/L
Anion Gap: 7 (ref 7–16)
BUN: 6 mg/dL — ABNORMAL LOW (ref 7–18)
Bilirubin,Total: 0.3 mg/dL (ref 0.2–1.0)
Calcium, Total: 8.7 mg/dL (ref 8.5–10.1)
Chloride: 97 mmol/L — ABNORMAL LOW (ref 98–107)
Co2: 30 mmol/L (ref 21–32)
Creatinine: 0.93 mg/dL (ref 0.60–1.30)
EGFR (African American): >60
GLUCOSE: 317 mg/dL — AB (ref 65–99)
OSMOLALITY: 278 (ref 275–301)
POTASSIUM: 4 mmol/L (ref 3.5–5.1)
SGOT(AST): 22 U/L (ref 15–37)
SODIUM: 134 mmol/L — AB (ref 136–145)
TOTAL PROTEIN: 8.1 g/dL (ref 6.4–8.2)

## 2014-09-06 LAB — CBC WITH DIFFERENTIAL/PLATELET
BASOS PCT: 0.8 %
Basophil #: 0.1 10*3/uL (ref 0.0–0.1)
EOS ABS: 0 10*3/uL (ref 0.0–0.7)
EOS PCT: 0.5 %
HCT: 44.8 % (ref 35.0–47.0)
HGB: 15 g/dL (ref 12.0–16.0)
LYMPHS PCT: 33.7 %
Lymphocyte #: 3.2 10*3/uL (ref 1.0–3.6)
MCH: 32.1 pg (ref 26.0–34.0)
MCHC: 33.5 g/dL (ref 32.0–36.0)
MCV: 96 fL (ref 80–100)
MONO ABS: 0.5 x10 3/mm (ref 0.2–0.9)
MONOS PCT: 4.8 %
Neutrophil #: 5.8 10*3/uL (ref 1.4–6.5)
Neutrophil %: 60.2 %
Platelet: 278 10*3/uL (ref 150–440)
RBC: 4.68 10*6/uL (ref 3.80–5.20)
RDW: 13.3 % (ref 11.5–14.5)
WBC: 9.6 10*3/uL (ref 3.6–11.0)

## 2014-09-06 LAB — URINALYSIS, COMPLETE
Bacteria: NONE SEEN
Bilirubin,UR: NEGATIVE
KETONE: NEGATIVE
Leukocyte Esterase: NEGATIVE
Nitrite: NEGATIVE
PH: 5 (ref 4.5–8.0)
Protein: 30
RBC,UR: 4 /HPF (ref 0–5)
Specific Gravity: 1.029 (ref 1.003–1.030)
Squamous Epithelial: 13
WBC UR: 14 /HPF (ref 0–5)

## 2014-09-06 LAB — PREGNANCY, URINE: PREGNANCY TEST, URINE: NEGATIVE m[IU]/mL

## 2014-09-12 ENCOUNTER — Emergency Department: Payer: Self-pay | Admitting: Emergency Medicine

## 2014-09-12 LAB — COMPREHENSIVE METABOLIC PANEL
ALT: 43 U/L
ANION GAP: 8 (ref 7–16)
Albumin: 3.4 g/dL (ref 3.4–5.0)
Alkaline Phosphatase: 109 U/L
BUN: 8 mg/dL (ref 7–18)
Bilirubin,Total: 0.3 mg/dL (ref 0.2–1.0)
CALCIUM: 8.7 mg/dL (ref 8.5–10.1)
CREATININE: 0.9 mg/dL (ref 0.60–1.30)
Chloride: 102 mmol/L (ref 98–107)
Co2: 26 mmol/L (ref 21–32)
EGFR (African American): >60
Glucose: 262 mg/dL — ABNORMAL HIGH (ref 65–99)
OSMOLALITY: 279 (ref 275–301)
Potassium: 4.3 mmol/L (ref 3.5–5.1)
SGOT(AST): 25 U/L (ref 15–37)
SODIUM: 136 mmol/L (ref 136–145)
Total Protein: 8 g/dL (ref 6.4–8.2)

## 2014-09-12 LAB — CBC WITH DIFFERENTIAL/PLATELET
Basophil #: 0.1 10*3/uL (ref 0.0–0.1)
Basophil %: 0.8 %
Eosinophil #: 0 10*3/uL (ref 0.0–0.7)
Eosinophil %: 0.4 %
HCT: 44.9 % (ref 35.0–47.0)
HGB: 15.3 g/dL (ref 12.0–16.0)
Lymphocyte #: 3.1 10*3/uL (ref 1.0–3.6)
Lymphocyte %: 35 %
MCH: 32.4 pg (ref 26.0–34.0)
MCHC: 34 g/dL (ref 32.0–36.0)
MCV: 95 fL (ref 80–100)
MONOS PCT: 4.4 %
Monocyte #: 0.4 x10 3/mm (ref 0.2–0.9)
Neutrophil #: 5.3 10*3/uL (ref 1.4–6.5)
Neutrophil %: 59.4 %
PLATELETS: 280 10*3/uL (ref 150–440)
RBC: 4.72 10*6/uL (ref 3.80–5.20)
RDW: 13.3 % (ref 11.5–14.5)
WBC: 8.9 10*3/uL (ref 3.6–11.0)

## 2014-09-12 LAB — URINALYSIS, COMPLETE
BILIRUBIN, UR: NEGATIVE
Blood: NEGATIVE
Hyaline Cast: 2
KETONE: NEGATIVE
LEUKOCYTE ESTERASE: NEGATIVE
Nitrite: NEGATIVE
PH: 5 (ref 4.5–8.0)
Protein: NEGATIVE
RBC,UR: 1 /HPF (ref 0–5)
Specific Gravity: 1.019 (ref 1.003–1.030)
Squamous Epithelial: 24
WBC UR: 3 /HPF (ref 0–5)

## 2014-09-12 LAB — LIPASE, BLOOD: Lipase: 116 U/L (ref 73–393)

## 2014-10-31 ENCOUNTER — Emergency Department: Payer: Self-pay | Admitting: Emergency Medicine

## 2014-10-31 LAB — COMPREHENSIVE METABOLIC PANEL
ALK PHOS: 111 U/L
ALT: 58 U/L
AST: 39 U/L — AB (ref 15–37)
Albumin: 3.4 g/dL (ref 3.4–5.0)
Anion Gap: 7 (ref 7–16)
BUN: 10 mg/dL (ref 7–18)
Bilirubin,Total: 0.2 mg/dL (ref 0.2–1.0)
CALCIUM: 8.9 mg/dL (ref 8.5–10.1)
CO2: 28 mmol/L (ref 21–32)
Chloride: 99 mmol/L (ref 98–107)
Creatinine: 0.98 mg/dL (ref 0.60–1.30)
EGFR (African American): >60
EGFR (Non-African Amer.): >60
GLUCOSE: 358 mg/dL — AB (ref 65–99)
Osmolality: 282 (ref 275–301)
Potassium: 4.1 mmol/L (ref 3.5–5.1)
Sodium: 134 mmol/L — ABNORMAL LOW (ref 136–145)
TOTAL PROTEIN: 8 g/dL (ref 6.4–8.2)

## 2014-10-31 LAB — URINALYSIS, COMPLETE
Bacteria: NONE SEEN
Bilirubin,UR: NEGATIVE
Blood: NEGATIVE
Glucose,UR: >=500 mg/dL (ref 0–75)
Ketone: NEGATIVE
Leukocyte Esterase: NEGATIVE
NITRITE: NEGATIVE
PH: 5 (ref 4.5–8.0)
PROTEIN: NEGATIVE
Specific Gravity: 1.028 (ref 1.003–1.030)
Squamous Epithelial: 4
WBC UR: 1 /HPF (ref 0–5)

## 2014-10-31 LAB — CBC WITH DIFFERENTIAL/PLATELET
BASOS PCT: 0.8 %
Basophil #: 0.1 10*3/uL (ref 0.0–0.1)
EOS PCT: 0.4 %
Eosinophil #: 0 10*3/uL (ref 0.0–0.7)
HCT: 45.3 % (ref 35.0–47.0)
HGB: 15.3 g/dL (ref 12.0–16.0)
Lymphocyte #: 2.8 10*3/uL (ref 1.0–3.6)
Lymphocyte %: 30.1 %
MCH: 31.8 pg (ref 26.0–34.0)
MCHC: 33.7 g/dL (ref 32.0–36.0)
MCV: 94 fL (ref 80–100)
Monocyte #: 0.5 x10 3/mm (ref 0.2–0.9)
Monocyte %: 4.9 %
NEUTROS PCT: 63.8 %
Neutrophil #: 6 10*3/uL (ref 1.4–6.5)
Platelet: 236 10*3/uL (ref 150–440)
RBC: 4.79 10*6/uL (ref 3.80–5.20)
RDW: 13.1 % (ref 11.5–14.5)
WBC: 9.4 10*3/uL (ref 3.6–11.0)

## 2015-02-09 ENCOUNTER — Emergency Department: Admit: 2015-02-09 | Disposition: A | Discharge status: Home / Self Care | Payer: Self-pay | Admitting: Student

## 2015-02-09 LAB — URINALYSIS, COMPLETE
BILIRUBIN, UR: NEGATIVE
Bacteria: NONE SEEN
Blood: NEGATIVE
Glucose,UR: >=500 mg/dL (ref 0–75)
Ketone: NEGATIVE
Leukocyte Esterase: NEGATIVE
Nitrite: NEGATIVE
Ph: 6 (ref 4.5–8.0)
Protein: NEGATIVE
Specific Gravity: 1.023 (ref 1.003–1.030)

## 2015-02-09 LAB — COMPREHENSIVE METABOLIC PANEL
ANION GAP: 9 (ref 7–16)
Albumin: 3.7 g/dL
Alkaline Phosphatase: 80 U/L
BUN: 12 mg/dL
Bilirubin,Total: <0.1 mg/dL — ABNORMAL LOW
CALCIUM: 8.8 mg/dL — AB
Chloride: 99 mmol/L — ABNORMAL LOW
Co2: 27 mmol/L
Creatinine: 0.96 mg/dL
Glucose: 307 mg/dL — ABNORMAL HIGH
Potassium: 4 mmol/L
SGOT(AST): 19 U/L
SGPT (ALT): 21 U/L

## 2015-02-09 LAB — CBC WITH DIFFERENTIAL/PLATELET
Basophil #: 0.1 10*3/uL (ref 0.0–0.1)
Basophil %: 1.2 %
EOS PCT: 0.7 %
Eosinophil #: 0.1 10*3/uL (ref 0.0–0.7)
HCT: 39.8 % (ref 35.0–47.0)
HGB: 13.9 g/dL (ref 12.0–16.0)
Lymphocyte #: 4.7 10*3/uL — ABNORMAL HIGH (ref 1.0–3.6)
Lymphocyte %: 37.3 %
MCH: 32.4 pg (ref 26.0–34.0)
MCHC: 34.9 g/dL (ref 32.0–36.0)
MCV: 93 fL (ref 80–100)
MONOS PCT: 4.7 %
Monocyte #: 0.6 x10 3/mm (ref 0.2–0.9)
NEUTROS PCT: 56.1 %
Neutrophil #: 7 10*3/uL — ABNORMAL HIGH (ref 1.4–6.5)
Platelet: 257 10*3/uL (ref 150–440)
RBC: 4.28 10*6/uL (ref 3.80–5.20)
RDW: 13 % (ref 11.5–14.5)
WBC: 12.5 10*3/uL — ABNORMAL HIGH (ref 3.6–11.0)

## 2015-02-09 LAB — PREGNANCY, URINE: PREGNANCY TEST, URINE: NEGATIVE m[IU]/mL

## 2015-02-09 LAB — LIPASE, BLOOD: Lipase: 51 U/L

## 2015-02-09 NOTE — Discharge Summary (Signed)
PATIENT NAME:  [name], [name] MR#:  [id number] DATE OF BIRTH:  03-19-1976  DATE OF ADMISSION:  09/02/2012 DATE OF DISCHARGE:  09/07/2012  PRIMARY CARE PHYSICIAN: Angus PalmsSionne George, MD   DISCHARGE DIAGNOSES:  1. Acute sigmoid diverticulitis.  2. Dehydration.  3. Morbid obesity.  4. Hypokalemia. 5. Dehydration.   CONSULT: Dr. Excell Seltzerooper of Surgery   IMAGING STUDIES DONE:  1. CT scan of the abdomen showed postop changes and sigmoid diverticulitis.  2. CT scan repeated again in four days on 09/05/2012 showed improving diverticulitis.   ADMITTING HISTORY AND PHYSICAL: Please see detailed history and physical dictated on 09/02/2012. In brief, this is a 39 year old morbidly obese female patient with past history of diverticulitis status post colectomy due to perforation who presented to the Emergency Room complaining of worsening abdominal pain, nausea, vomiting, and diarrhea. The patient's CT scan showed diverticulitis and she was admitted to the hospitalist service. Surgery was consulted for the same.   HOSPITAL COURSE: For acute diverticulitis, the patient was started on ciprofloxacin and Flagyl. The patient had slow improvement with initial worsening for which a CT scan was repeated which showed improving diverticulitis. No abscess or perforation was found. Dr. Excell Seltzerooper followed the patient during the hospital stay and suggested no surgery. The patient is to follow-up with Dr. Egbert GaribaldiBird in two weeks after discharge. The patient has received five days of antibiotics and will be on nine more days of ciprofloxacin and Flagyl as outpatient. On the day of discharge, the patient is tolerating her food, has no diarrhea, abdominal pain is significantly better and is discharged home in a fair condition.   DISCHARGE VITALS: Temperature 97.6, pulse 84, blood pressure 135/94.   DISCHARGE MEDICATIONS:  1. Ciprofloxacin 500 mg oral twice a day for nine days.  2. Flagyl 500 mg oral 3 times a day for nine days.  3. Zofran  ODT 4 mg 4 times a day as needed for nausea.  4. Acetaminophen/hydrocodone 5/325 one tablet every four hours as needed for pain, 20 pills given.  5. Docusate sodium 100 mg oral twice a day as needed for constipation.   DISCHARGE INSTRUCTIONS:  1. Follow-up with primary care physician, Dr. Angus PalmsSionne George, in one week and Dr. Egbert GaribaldiBird of Surgery in two weeks.  2. The patient will be on a regular diet.  3. Activity as tolerated.  4. The patient has been instructed to call her doctor if she has any fever or worsening abdominal pain or blood in stools.   This plan was discussed with the patient and her mom at bedside.   TIME SPENT: Time spent on the day of discharge and discharge activity was 40 minutes.   ____________________________ Molinda BailiffSrikar R. Dagoberto Nealy, MD srs:drc D: 09/07/2012 11:56:16 ET T: 09/09/2012 10:35:41 ET JOB#: [id number]  cc: Wardell HeathSrikar R. Rishit Burkhalter, MD, <Dictator> Angus PalmsSionne George, MD Orie FishermanSRIKAR R Twala Collings MD ELECTRONICALLY SIGNED 09/09/2012 16:06

## 2015-02-09 NOTE — Consult Note (Signed)
Brief Consult Note: Diagnosis: rec diverticulitis.   Patient was seen by consultant.   Consult note dictated.   Recommend further assessment or treatment.   Orders entered.   Comments: cont IV abx will follow.  Electronic Signatures: Lattie Hawooper, Richard E (MD)  (Signed 11-Nov-13 15:43)  Authored: Brief Consult Note   Last Updated: 11-Nov-13 15:43 by Lattie Hawooper, Richard E (MD)

## 2015-02-09 NOTE — Consult Note (Signed)
PATIENT NAME:  [name], [name] MR#:  [id number] DATE OF BIRTH:  08-28-1976  DATE OF CONSULTATION:  09/02/2012  REFERRING PHYSICIAN:   CONSULTING PHYSICIAN:  Adah Salvageichard E. Excell Seltzerooper, MD  CHIEF COMPLAINT: Left lower quadrant pain.   HISTORY OF PRESENT ILLNESS: This is a patient with a known history of acute diverticulitis. She actually underwent a hand-assisted laparoscopic sigmoid colon resection by Dr. Egbert GaribaldiBird in December of 2012 and she had been doing well. She had seen Dr. Egbert GaribaldiBird in the office last week for continued abdominal pain, however, and Dr. Egbert GaribaldiBird had suggested some testing. She came to the Emergency Room on 09/02/2012 with some nausea and some pain and a CT scan, suggestive of diverticulitis, was obtained. She denies fevers or chills, has felt like she had to have a bowel movement. She has had no nausea or vomiting.   PAST MEDICAL HISTORY:  1. Hypertension.  2. Diverticulitis. 3. Depression.  4. Kidney stones. 5. Morbid obesity.   PAST SURGICAL HISTORY:  1. Colon resection. 2. Ectopic pregnancy. 3. Ankle surgery.   FAMILY HISTORY: Noncontributory.   SOCIAL HISTORY: She does not smoke and does not drink. She is not employed.  REVIEW OF SYSTEMS: A 10 system review has been performed and negative with the exception of that mentioned in the history of present illness.   MEDICATIONS: Delineated in the chart.  ALLERGIES: Delineated in the chart.   PHYSICAL EXAMINATION:   GENERAL: Morbidly obese female patient with a BMI of 47.   HEENT: No scleral icterus.   NECK: No palpable neck nodes.   PULMONARY: Chest is clear to auscultation.   CARDIAC: Regular rate and rhythm.   ABDOMEN: Obese, soft, minimally tender in both lower quadrants. No peritoneal signs.   EXTREMITIES: No edema. Calves are nontender.   NEUROLOGIC: Grossly intact.   INTEGUMENT: No jaundice.  LABS/RADIOLOGIC STUDIES: Laboratory values demonstrate a white blood cell count of 12.2 and hemoglobin and hematocrit of  11.9 and 34.7.  CT scan is suggestive of diverticulitis.   ASSESSMENT AND PLAN: This is a patient with diverticulitis following colon resection. She  has been admitted to the hospital by the medical service who has asked me to consult concerning her abdominal pain. She has no signs of peritoneal irritation. My recommendations are for continuing her IV antibiotics at this time and hopefully getting her back to Dr. Egbert GaribaldiBird in his office where his plans will be discussed with the patient. ____________________________ Adah Salvageichard E. Excell Seltzerooper, MD rec:slb D: 09/03/2012 14:43:00 ET     T: 09/03/2012 15:34:15 ET       JOB#: [id number] cc: Adah Salvageichard E. Excell Seltzerooper, MD, <Dictator> Lattie HawICHARD E COOPER MD ELECTRONICALLY SIGNED 09/04/2012 7:57

## 2015-02-09 NOTE — H&P (Signed)
PATIENT NAME:  [name], [name] MR#:  [id number] DATE OF BIRTH:  1976/02/23  DATE OF ADMISSION:  09/02/2012  PRIMARY CARE PHYSICIAN: [facility]  REQUESTING PHYSICIAN: Dr. Zenda Alpers   CHIEF COMPLAINT: Abdominal pain.   HISTORY OF PRESENT ILLNESS: Patient is a 39 year old female with a known history of depression, hypertension, diverticulosis, and kidney stones is being admitted for acute diverticulitis. Patient was admitted in December 2012 with recurrent diverticulitis episode and required descending colectomy due to contained perforation of descending colon secondary to diverticulitis. Patient had been doing okay since then but started having severe abdominal pain early yesterday morning when she was getting ready for church. She also reports having fever on and off since yesterday. Also having loose watery stool at least two times yesterday. She denied any blood in the stool. Her pain was mainly in the left lower abdomen and was very severe. She could not move her body and she would only feel comfortable in the right lateral position here in the hospital. Any tossing or turning would maker her pain in the belly worse.    PAST MEDICAL HISTORY:  1. Hypertension.  2. Depression.  3. Diverticulosis.  4. Kidney stones.   PAST SURGICAL HISTORY:  1. Ectopic pregnancy.  2. Right ankle surgery x4.  3. Right ankle fusion.  4. Descending colectomy with primary anastomosis December 2012.   ALLERGIES: Morphine, latex gloves.   FAMILY HISTORY: Positive for sister with severe diverticulitis. Father with intestinal rupture requiring colostomy for a while. He was alcoholic.   SOCIAL HISTORY: She is a former Sales executive. She is a former smoker and drinker, not done any of those for at least last year or two. 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   MEDICATIONS AT HOME: None.   REVIEW OF SYSTEMS: CONSTITUTIONAL: Intermittent fever. No fatigue or weakness. EYES: No blurry or  double vision. ENT: No tinnitus or ear pain. RESPIRATORY: No cough, wheezing, hemoptysis. CARDIOVASCULAR: No chest pain, orthopnea, edema. GASTROINTESTINAL: Positive for nausea, diarrhea, and abdominal pain. History of recurrent diverticulitis requiring surgery. GENITOURINARY: No dysuria, hematuria. ENDOCRINE: No polyuria, nocturia. HEMATOLOGY: No anemia, easy bruising. SKIN: No rash or lesion. MUSCULOSKELETAL: No arthritis, muscle cramp. NEUROLOGIC: No tingling, numbness, weakness. PSYCHIATRIC: History of depression. No anxiety.   PHYSICAL EXAMINATION:  VITAL SIGNS: Temperature 98.5, heart rate 110 per minute, respirations 20 per minute, blood pressure 127/81 mmHg. She is saturating 100% on room air.  GENERAL: Patient is a 39 year old female lying in the bed with some acute abdominal pain, not comfortable with any position except right lateral.   EYES: Pupils equal, round, reactive to light and accommodation. No scleral icterus. Extraocular muscles intact.   HENT: Head atraumatic, normocephalic. Oropharynx and nasopharynx clear.   NECK: Supple. No jugular venous distention. No thyroid enlargement or tenderness.   LUNGS: Clear to auscultation bilaterally. No wheezing, rales, rhonchi, crepitation.   CARDIOVASCULAR: S1, S2 normal. No murmurs, rales, gallop.   ABDOMEN: Soft. Has left lower quadrant tenderness. No guarding or rigidity. No organomegaly appreciated.   NEUROLOGIC: Cranial nerves II through XII intact. Muscle strength 5/5 in all extremities. Sensation intact.   PSYCHIATRIC: Patient is oriented to time, place, and person x3.   SKIN: No obvious rash, lesion, ulcer. She has lower midline surgical incision on her abdomen which is well healed from previous surgery.   LABORATORY, DIAGNOSTIC AND RADIOLOGICAL DATA: Normal BMP. Normal liver function tests. Negative urinalysis.   CT scan of the abdomen and pelvis in  the ED showed acute diverticulitis at the junction of sigmoid and  descending colon without evidence of abscess or perforation.   IMPRESSION AND PLAN:  1. Acute diverticulitis. Will start her on IV Cipro and Flagyl. Keep her n.p.o. except medication. Consult surgery. Provide pain medication oral and IV and start her on IV fluids.  2. Sinus tachycardia, likely due to acute diverticulitis and abdominal pain. Will provide pain medication as needed in the form of IV Dilaudid and Norco. Monitor her heart rate.  3. Abdominal pain in the left lower quadrant, likely related to diverticulitis. Monitor closely while on antibiotics and provide pain management with as needed pain medication.  4. Depression, not on any medication at this time. Will hold off at this time. She used to take Klonopin in the past.  5. CODE STATUS: FULL CODE.   TOTAL TIME TAKING CARE OF THIS PATIENT: 55 minutes.   ____________________________ Ellamae SiaVipul S. Sherryll BurgerShah, MD vss:cms D: 09/02/2012 05:09:29 ET T: 09/02/2012 08:30:48 ET JOB#: [id number]  cc: Francine Hannan S. Sherryll BurgerShah, MD, <Dictator> [facility] Angus PalmsSionne George, MD Loraine LericheMark A. Egbert GaribaldiBird, MD Patricia PesaVIPUL S Sylvester Minton MD ELECTRONICALLY SIGNED 09/03/2012 21:04

## 2015-02-13 NOTE — Consult Note (Signed)
PATIENT NAME:  [name], Dazani R MR#:  [id number] DATE OF BIRTH:  06/18/1976  DATE OF CONSULTATION:  01/05/2014  REFERRING PHYSICIAN:   CONSULTING PHYSICIAN:  Adah Salvageichard E. Excell Seltzerooper, MD  CHIEF COMPLAINT: Abdominal pain.   HISTORY OF PRESENT ILLNESS: This is a patient with left lower quadrant abdominal pain typical of her recurrent diverticulitis following resection for diverticulitis.  She has had this multiple times in the past, in fact, was hospitalized a year and a half ago, when I saw her personally for that problem.  She also described right upper quadrant and right flank pain. I was asked to see the patient in the Emergency Room in consultation. She is to be admitted by [facility] for treatment. She denies nausea or vomiting, has had no fevers or chills. This pain started 4 days ago. She has been on oral antibiotics without improvement.   PAST MEDICAL HISTORY: Diverticulosis and diverticulitis, depression, hypertension and obesity, nephrolithiasis and diabetes.   PAST SURGICAL HISTORY: Hand-assisted sigmoid colon resection by Dr. Egbert GaribaldiBird in December 2012, ectopic pregnancy, ankle fracture.   ALLERGIES: MORPHINE.   FAMILY HISTORY: Noncontributory.   SOCIAL HISTORY: The patient is an ex-smoker.   REVIEW OF SYSTEMS: A 10-system review has been performed and negative with the exception of that mentioned in the HPI.    PHYSICAL EXAMINATION:  The patient is seen in the Emergency Room.  VITAL SIGNS: Temp is 98.3, pulse of 100, respirations 18, blood pressure 164/114, oxygen saturation 96%.  HEENT: No scleral icterus.  NECK: No palpable neck nodes.  CHEST: Clear to auscultation.  CARDIAC: Regular rate and rhythm.  ABDOMEN: Soft. There is tenderness in the left lower quadrant, suprapubic area and right upper quadrant and right flank without costovertebral angle tenderness.  EXTREMITIES: Show minimal edema.  NEUROLOGIC: Grossly intact.  GENERAL:  The patient is obese with a BMI of 47.   LABORATORY,  DIAGNOSTIC AND RADIOLOGICAL DATA:   1.  UA is clean.  2.  White blood cell count is 8.5, H and H of 13 and 38.  3.  Electrolytes are within normal limits, sodium depressed at 132.  4.  An ultrasound suggests a right lower pole kidney stone.  5.  A CT scan was performed several days ago, demonstrating diverticulitis.   ASSESSMENT AND PLAN: This is a patient with recurrent diverticulitis, typical of her multiple episodes in the past. I am in agreement with admission and IV antibiotics. I do believe that her right upper quadrant pain may be related to this lower pole kidney stone and may require further therapy although this cramping pain in her right upper quadrant is not bothering her nearly as much as the constant pain in her left lower quadrant.  I would be happy to follow this patient while in the hospital. I have discussed her with the Emergency Room physician and  [facility].   ____________________________ Adah Salvageichard E. Excell Seltzerooper, MD rec:cs D: 01/06/2014 15:38:00 ET T: 01/06/2014 15:49:28 ET JOB#: [id number]  cc: Adah Salvageichard E. Excell Seltzerooper, MD, <Dictator> Lattie HawICHARD E COOPER MD ELECTRONICALLY SIGNED 01/06/2014 18:29

## 2015-02-13 NOTE — Consult Note (Signed)
Brief Consult Note: Diagnosis: Diverticulitis.   Comments: Ms. [name] is a pleasant morbidly obese caucasian female who is well known to our practice with hx recurrent diverticulitis s/p sigmoid colectomy for perforated diverticulitis 12/12, IBS, fatty liver, ventral hernia, & GERD.  She is admitted with uncomplicated recurrent sigmoid diverticulitis.  Last colonoscopy 12/2012 by Dr Bluford Kaufmannh showed divrticulosis.    Plan: 1) Supportive measures including pain control, IVFs, antiemetics 2) Agree with IV levaquin & flagyl 3) will follow with you Thanks for allowing us to participate in her care.  Please see full dictated note. #[id number].  Electronic Signatures: Joselyn ArrowJones, Imanuel Pruiett L (NP)  (Signed 17-Mar-15 14:08)  Authored: Brief Consult Note   Last Updated: 17-Mar-15 14:08 by Joselyn ArrowJones, Smrithi Pigford L (NP)

## 2015-02-13 NOTE — H&P (Signed)
PATIENT NAME:  [name], [name]#:  [id number] DATE OF BIRTH:  1976-09-26  DATE OF ADMISSION:  01/05/2014  PRIMARY CARE PHYSICIAN: At the [facility].  PRIMARY GASTROENTEROLOGIST: Dr. Bluford Kaufmannh. REFERRING PHYSICIAN: Dr. Carollee MassedKaminski.   CHIEF COMPLAINT: Abdominal pain.   HISTORY OF PRESENT ILLNESS: The patient is a pleasant 39 year old morbidly obese female with a history of recurrent diverticulosis and diverticulitis status post partial colectomy for perforation in 2013, who comes in to the hospital today for the above chief complaint. She stated that her pain started 4 days ago. She came into the ER on the 13th, was given Cipro and Flagyl, and a CAT scan was done showing sigmoid diverticulitis. She went home. She states she has not felt better at times she feels worse. The pain is more generalized now. She has had no fevers but had some sweats and came into the hospital today.   Of note: Her white count has gone down to 8.5 from 14.1 and the hospitalist service was contacted for further evaluation and management.   PAST MEDICAL HISTORY:  1.  Diverticulosis with recurrent diverticulitis, requiring descending colectomy secondary to perforation.  2.  Depression.  3.  Hypertension.  4.  Nephrolithiasis.  5.  Diabetes.   PAST SURGICAL HISTORY: Ectopic pregnancy, right ankle surgery, right ankle effusion, descending colectomy with primary anastomosis in December of 2012.   ALLERGIES: MORPHINE CAUSING ITCHES AND LATEX.   FAMILY HISTORY: Positive for sister with diverticulitis. Father with intestinal rupture requiring colostomy. She is also an alcoholic.   SOCIAL HISTORY: Ex-smoker. No alcohol or drug use. Is a Optician, dispensingminister.   OUTPATIENT MEDICATIONS: Benefiber 1 g daily; Cipro 500 mg every 12 hours, started on March 13; dicyclomine 10 mg two caps 4 times a day with meals; Flagyl 500 mg two times a day, started on the 13; metformin 500 mg two tabs once a day; Norco 325/5 mg one to 2 tabs every 4 hours  as needed for pain; Zofran ODT p.r.n.   REVIEW OF SYSTEMS:  CONSTITUTIONAL: Positive for weight gain recently. Some sweats. No fevers or chills.  EYES: No blurry vision or double vision.  ENT: No tinnitus or hearing loss.  RESPIRATORY: No cough, wheezing, shortness of breath or COPD.  CARDIOVASCULAR: No chest pain, palpitations but has had some dizziness. GASTROINTESTINAL: Positive for nausea. Has been having more of a dry heaving today but no significant vomiting. More of a diffuse abdominal pain. History of diverticulitis and surgery for that.  GENITOURINARY: Denies dysuria or hematuria.  HEMOLYMPHATIC: No anemia or easy bruising.  SKIN: No rashes.  MUSCULOSKELETAL: Denies arthritis or gout.  NEUROLOGIC: No focal weakness or numbness.  PSYCHIATRIC: Denies anxiety or depression.   PHYSICAL EXAMINATION:  VITAL SIGNS: Temperature on arrival 98.3, pulse rate 105, respiratory rate 20, blood pressure 164/114, oxygen saturation 97%.  GENERAL: A morbidly obese female sitting in bed, talking in full sentences.  HEENT: Normocephalic, atraumatic. Pupils are equal and reactive. Anicteric sclerae. Moist mucous membranes.  NECK: Supple. No thyroid tenderness. No cervical lymphadenopathy.  CARDIOVASCULAR: S1, S2, tachycardic. No significant murmurs appreciated.  LUNGS: Clear to auscultation without wheezing, rhonchi or rales.  ABDOMEN: Soft, morbidly obese. Healed midline long surgical scar. The patient has more of a diffuse tenderness. No rebound or guarding. Pain is more on the left side.  EXTREMITIES: No pitting edema.  SKIN: No obvious rashes or lesions.  NEUROLOGIC: Cranial nerves II though 12 appear grossly intact. Strength is five out of five in all extremities.  Sensation is intact to light touch.  PSYCHIATRIC: Awake, alert and oriented x3.   LABORATORY, DIAGNOSTIC AND RADIOLOGIC DATA: White count of 8.5. Of note: It was 14.1 on March 13. Hemoglobin 13.3, platelets 234. Urinalysis does not  suggest an infection. LFTs show albumin of 3.3; otherwise, within normal limits. Sodium 132, potassium 4, BUN 8, creatinine 0.89.   Today she had abdomen 3-way including chest, PA, showing no acute abnormalities.   The patient also underwent an ultrasound of kidneys bilaterally today showing no hydronephrosis, 4 mm echogenic focus in the lower pole of the right kidney. May correspond to the nonobstructing stone seen on the recent CT.   Abdomen ultrasound, limited survey, shows gallbladder sludge and fatty liver.   ASSESSMENT AND PLAN: We have a morbidly obese 39 year old with a history of recurrent diverticulitis with an episode of perforation requiring a descending colectomy, hypertension,  and diabetes who presents with diverticulitis 3 days ago and not significantly better with 3 days of p.o. antibiotics with worsening pain and some bouts of nausea and dry heaving.   Although the white count has gone down, she feels no significant improvement. We will admit her for IV antibiotics with Levaquin and Flagyl. Obtain a gastroenterology consult. Start her on Dilaudid, and put her on clears, and start her on some Zofran p.r.n. for pain. We would follow with gastroenterology input and consider a surgical consult and repeat a CAT scan in a day or two if the patient again is not significantly better or feels worse.   Would follow with a CBC. Put her on sequential compression devices and thromboembolic disease stockings hose for deep vein thrombosis prophylaxis. I would also start her on sliding-scale insulin and check a hemoglobin A1c.   CODE STATUS: FULL CODE.   TOTAL TIME SPENT: 35 minutes.    ____________________________ Krystal Eaton, MD sa:np D: 01/05/2014 18:06:10 ET T: 01/05/2014 19:57:05 ET JOB#: [id number]  cc: Krystal Eaton, MD, <Dictator> [facility] Marcelle Smiling [facility] MD ELECTRONICALLY SIGNED 01/22/2014 15:54

## 2015-02-13 NOTE — Consult Note (Signed)
Chief Complaint:  Subjective/Chief Complaint Pt continues to have 8/10 LLQ pain & right flank pain.  c/o nausea without vomiting.  3 loose BMS last night.   VITAL SIGNS/ANCILLARY NOTES:  **Vital Signs.:   18-Mar-15 05:40  Vital Signs Type Routine  Temperature Temperature (F) 97.5  Celsius 36.3  Temperature Source oral  Pulse Pulse 87  Respirations Respirations 18  Systolic BP Systolic BP 412  Diastolic BP (mmHg) Diastolic BP (mmHg) 83  Mean BP 94  Pulse Ox % Pulse Ox % 92  Pulse Ox Activity Level  At rest  Oxygen Delivery Room Air/ 21 %    09:15  Pulse Pulse 97  Respirations Respirations 20  Systolic BP Systolic BP 878  Diastolic BP (mmHg) Diastolic BP (mmHg) 83  Mean BP 105  Pulse Ox % Pulse Ox % 95  Pulse Ox Activity Level  At rest  Oxygen Delivery Room Air/ 21 %   Brief Assessment:  GEN well developed, no acute distress, obese, A/Ox3   Cardiac Regular    Respiratory normal resp effort    Gastrointestinal details normal Soft  No rebound tenderness  No gaurding  +mild TTP right flank, LLQ.    EXTR negative cyanosis/clubbing, negative edema   Additional Physical Exam Skin: pink, warm, dry   Lab Results:  Routine Chem:  18-Mar-15 05:07   Glucose, Serum  181  BUN 7  Creatinine (comp) 0.83  Sodium, Serum  135  Potassium, Serum 3.5  Chloride, Serum 102  CO2, Serum 31  Calcium (Total), Serum  7.8  Anion Gap  2  Osmolality (calc) 273  eGFR (African American) >60  eGFR (Non-African American) >60 (eGFR values <57m/min/1.73 m2 may be an indication of chronic kidney disease (CKD). Calculated eGFR is useful in patients with stable renal function. The eGFR calculation will not be reliable in acutely ill patients when serum creatinine is changing rapidly. It is not useful in  patients on dialysis. The eGFR calculation may not be applicable to patients at the low and high extremes of body sizes, pregnant women, and vegetarians.)  Routine Hem:  18-Mar-15 05:07    WBC (CBC) 8.2  RBC (CBC) 4.11  Hemoglobin (CBC) 13.4  Hematocrit (CBC) 39.1  Platelet Count (CBC) 223  MCV 95  MCH 32.5  MCHC 34.1  RDW 13.3  Neutrophil % 60.2  Lymphocyte % 32.6  Monocyte % 5.6  Eosinophil % 1.1  Basophil % 0.5  Neutrophil # 5.0  Lymphocyte # 2.7  Monocyte # 0.5  Eosinophil # 0.1  Basophil # 0.0 (Result(s) reported on 07 Jan 2014 at 05:28AM.)   Assessment/Plan:  Assessment/Plan:  Assessment Acute diverticulitis: Improving.  On levaquin/flagyl Diarrhea: check c diff   Plan 1) c diff PCR 2) contact precautions until c diff returned 3) continue antibiotics 4) continue supportive measures Please call if you have any questions or concerns   Electronic Signatures: JAndria Meuse(NP)  (Signed 18-Mar-15 11:47)  Authored: Chief Complaint, VITAL SIGNS/ANCILLARY NOTES, Brief Assessment, Lab Results, Assessment/Plan   Last Updated: 18-Mar-15 11:47 by JAndria Meuse(NP)

## 2015-02-13 NOTE — Consult Note (Signed)
Brief Consult Note: Diagnosis: rec diverticulitis.   Patient was seen by consultant.   Consult note dictated.   Recommend further assessment or treatment.   Discussed with Attending MD.   Comments: rec diverticulitis after resection, colonoscopy last year. Disc with ED and PD. IV abx, will follow. Also has rt ureteral  lower pole stone.  Electronic Signatures: Lattie Hawooper, Kaileb Monsanto E (MD)  (Signed 16-Mar-15 18:16)  Authored: Brief Consult Note   Last Updated: 16-Mar-15 18:16 by Lattie Hawooper, Rikki Trosper E (MD)

## 2015-02-13 NOTE — Consult Note (Signed)
Chief Complaint:  Subjective/Chief Complaint Pt continues to have 8/10 LLQ pain & right flank pain.  c/o nausea, vomited once last night.  No further diarrhea.   VITAL SIGNS/ANCILLARY NOTES: **Vital Signs.:   19-Mar-15 04:49  Temperature Temperature (F) 98  Celsius 36.6  Temperature Source oral  Pulse Pulse 94  Respirations Respirations 18  Systolic BP Systolic BP 146  Diastolic BP (mmHg) Diastolic BP (mmHg) 91  Mean BP 109  Pulse Ox % Pulse Ox % 93  Pulse Ox Activity Level  At rest  Oxygen Delivery Room Air/ 21 %   Brief Assessment:  GEN well developed, no acute distress, obese, A/Ox3   Cardiac Regular   Respiratory normal resp effort   Gastrointestinal details normal Soft  No rebound tenderness  No gaurding  +moderate TTP right flank, LLQ.   EXTR negative cyanosis/clubbing, 1+ ankle edema bilat, nonpitting   Additional Physical Exam Skin: pink, warm, dry   Radiology Results: CT:    18-Mar-15 19:13, CT Abdomen and Pelvis With Contrast  CT Abdomen and Pelvis With Contrast   REASON FOR EXAM:    (1) abd pain,  not sig better, recent diverticulitis;   (2) llq abd pain  COMMENTS:       PROCEDURE: CT  - CT ABDOMEN / PELVIS  W  - Jan 07 2014  7:13PM     CLINICAL DATA:  Previous colectomy due to diverticulitis now with  abdominal pain    EXAM:  CT ABDOMEN AND PELVIS WITH CONTRAST    TECHNIQUE:  Multidetector CT imaging of the abdomen and pelvis was performed  using the standard protocol following bolus administration of  intravenous contrast.    CONTRAST:  100 cc of Isovue370    COMPARISON:  01/02/2014    FINDINGS:  The lung bases appear clear. No pleural or pericardial effusion  identified.    There is no focal liver abnormality. Diffuse low attenuation  throughout the liver is identified compatible with hepatic  steatosis. The gallbladder appears normal. No biliary dilatation.  Normal appearance of the pancreas. The spleen is unremarkable.  The adrenal  glands are both normal. Stone within the midpole of the  right kidney measures 4 mm. The left kidney is normal.The urinary  bladder appears within normal limits. Prostate gland and seminal  vesicles are normal. 1.9 cm left ovarian hypodensity is identified.  This is compared with 2.2 cm previously. Normal caliber of the  abdominal aorta. No aneurysm. There isno upper abdominal  adenopathy. No pelvic or inguinal adenopathy noted.    The stomach is normal. The small bowel loops have a normal caliber  without evidence for bowel obstruction. Mild diffuse wall thickening  involving the entire colon compatiblewith colitis. Previously noted  focal area of inflammation involving the sigmoid colon is again  noted. On the coronal images this appears to represent a sinus tract  which extends towards the left adnexa, image 94/series 5 and image  98/series 5. Nosignificant abscess identified.  There is pronounced laxity of the ventral abdominal wall. There are  multiple small defects within this area of laxity through which  abdominal fat herniates. There is also a fat containing umbilical  hernia, image 122/series 6. Next     IMPRESSION:  1. Interval diffuse wall thickening of the colon which is concerning  for colitis.  2. Persistent focal area of inflammation arising from the sigmoid  colon. On today's study this appears to represent a sinus tract  which is extending towards the left  adnexal region and may involve  the left ovary.  3. Hepatic steatosis.    Electronically Signed    By: Signa Kellaylor  Stroud M.D.    On: 01/07/2014 19:40         Verified By: Rosealee AlbeeAYLOR H. 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  Reviewed CT findings with Dr Excell Seltzerooper & Dr Servando SnareWohl.  Discussed gastrogafin enema with Dr Allena KatzPatel & orders placed to further evaluate fistula  tract. Diarrhea: None in past 24 hrs.  C diff negative.   Plan 1) continue flagyl/levaquin 2) continue supportive measures 3) gastrografin enema  Please call if you have any questions or concerns   Electronic Signatures: Joselyn ArrowJones, Garrie Woodin L (NP)  (Signed 19-Mar-15 13:37)  Authored: Chief Complaint, VITAL SIGNS/ANCILLARY NOTES, Brief Assessment, Radiology Results, Assessment/Plan   Last Updated: 19-Mar-15 13:37 by Joselyn ArrowJones, Asmara Backs L (NP)

## 2015-02-13 NOTE — Consult Note (Signed)
Chief Complaint:  Subjective/Chief Complaint Pt states she feels much better, ready to go home.  Pain 4/10 w/meds.  Denies nausea or vomiting.  No further diarrhea.  Appetite improving slowly.   VITAL SIGNS/ANCILLARY NOTES: **Vital Signs.:   20-Mar-15 05:27  Vital Signs Type Routine  Temperature Temperature (F) 97.6  Celsius 36.4  Temperature Source oral  Pulse Pulse 85  Respirations Respirations 18  Systolic BP Systolic BP 123  Diastolic BP (mmHg) Diastolic BP (mmHg) 79  Mean BP 93  Pulse Ox % Pulse Ox % 94  Pulse Ox Activity Level  At rest  Oxygen Delivery Room Air/ 21 %   Brief Assessment:  GEN well developed, no acute distress, obese, A/Ox3   Cardiac Regular   Respiratory normal resp effort   Gastrointestinal details normal Soft  Nontender  Bowel sounds normal  No rebound tenderness  No gaurding   EXTR negative cyanosis/clubbing, 1+ ankle edema bilat, nonpitting   Additional Physical Exam Skin: pink, warm, dry   Radiology Results: XRay:    19-Mar-15 15:15, Barium Enema Colon  Barium Enema Colon   REASON FOR EXAM:    to evaluate diverticulitis with fistula tract left   ovary/GASTROGRAFIN ENEMA/disc  COMMENTS:       PROCEDURE: FL  - FL BARIUM ENEMA (COLON)  - Jan 08 2014  3:15PM     CLINICAL DATA:  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    FLUOROSCOPY TIME:  42 seconds  COMPARISON:  CT ABD-PELV W/ CM dated 01/07/2014; CT ABD-PELV W/ CM  dated 01/02/2014    FINDINGS:  There is satisfactory distention of the rectum and sigmoid colon.  There is no extraluminal contrast visualized. There is a small  diverticulum along the anti mesenteric aspect of the sigmoid colon  correlating with the abnormality seen on CT. There is no  extravasation of contrast.     IMPRESSION:  No extraluminal contrast. There is a small diverticulum along  the  anti mesenteric aspect of the sigmoid colon correlating with the CT  findings. There is no contrast extending beyond the diverticulum.  Electronically Signed    By: Elige KoHetal  Patel    On: 01/08/2014 17:31         Verified By: Joellyn HaffHETAL P. PATEL, M.D., MD   Assessment/Plan:  Assessment/Plan:  Assessment Acute uncomplicated diverticulitis:  No evidence of fistula tract on gastrografin enema.   Pt improving.  Discussed with Dr. Excell Seltzerooper.   Diarrhea: Resolved   Plan 1) complete course of flagyl/levaquin for 10 days 2) continue supportive measures 3) low fiber diet x4 weeks, then high fiber diet 4) Pt OK for discharge from surgery/GI standpoint  5) Keep office followup with us as planned Please call if you have any questions or concerns   Electronic Signatures: Joselyn ArrowJones, Lathen Seal L (NP)  (Signed 20-Mar-15 08:49)  Authored: Chief Complaint, VITAL SIGNS/ANCILLARY NOTES, Brief Assessment, Radiology Results, Assessment/Plan   Last Updated: 20-Mar-15 08:49 by Joselyn ArrowJones, Susano Cleckler L (NP)

## 2015-02-13 NOTE — Discharge Summary (Signed)
PATIENT NAME:  [name], [name] MR#:  [id number] DATE OF BIRTH:  September 08, 1976  DATE OF ADMISSION:  01/05/2014 DATE OF DISCHARGE:  01/09/2014  CONSULTANTS: Dr. Excell Seltzer from surgery. [name] from GI.   CHIEF COMPLAINT: Abdominal pain.   DISCHARGE DIAGNOSES: 1.  Diverticulitis/colitis. 2.  Diabetes.  3.  Depression.  4.  Hypertension.  5.  History of nephrolithiasis.  6. History of diverticulosis with recurrent diverticulitis requiring descending colectomy secondary to perforation.   DISCHARGE MEDICATIONS: Dicyclomine 10 mg 2 caps 4 times a day with meals, metformin 500 mg 2 tabs once a day, Zofran ODT 4 mg every 8 hours as needed, Norco 325/5 mg 1 to 2 tabs every 4 hours as needed, Flagyl 500 mg every 8 hours for 5 days, Levaquin 500 mg every 24 hours for 5 days.   DIET: Low-sodium, low-fat, low-cholesterol, ADA diet. Low fiber diet for 4 weeks, then high-fiber diet after that.  Please follow with GI and surgery within 1 to 2 weeks.   DISPOSITION: Home.  SIGNIFICANT LABORATORIES AND IMAGING:  Initial BUN 8, creatinine of 0.89. Sodium 132, potassium 4. LFTs showed albumin of 3.3, otherwise, within normal limits. Hemoglobin A1c of 10.9. White count of 8.5, hemoglobin 13.3, platelets of 234. UA did not suggest an infection. C. difficile was negative.  CT of abdomen and pelvis done prior to admission on 03/13 had shown focal acute sigmoid diverticulitis in the left lower quadrant, chronic hepatic steatosis, nonobstructive punctate right intrarenal calculus, 2.2 cm left ovarian cyst.  CT of abdomen and pelvis with contrast on 03/18 showed interval diffuse wall thickening of the colon which is concerning for colitis, persistent focal area of inflammation arising from the sigmoid colon with possible sinus tract which is extending towards the left adnexal region.  Ultrasound of the kidneys bilaterally on 03/16 showed no hydronephrosis, 4 mm echogenic focus in the lower of the right kidney could  be a stone.   Ultrasound of the abdomen, limited survey, on 03/16: Fatty liver, gallbladder sludge.  Barium enema with colon shows no extraluminal contrast. There is a small diverticulum along the antimesenteric aspect of the sigmoid colon correlating with the CT finding. There is no contrast extending beyond the diverticulum.  HISTORY OF PRESENT ILLNESS AND HOSPITAL COURSE: For full details of H and P, please see the dictation on 03/16 by Dr. Jacques Navy.  But briefly, this an obese 39 year old female with history of recurrent diverticulitis with a perforation requiring descending colectomy who, of note, had abdominal pain, was noted to have evidence for diverticulitis on the 13th in the ER and was discharged on Cipro, Flagyl with some pain meds. She came back to the hospital as the pain did not significantly improve. However, her white count had improved from 14.1 to 8.5. She was admitted to the hospitalist service for IV antibiotics and antiemetics, IV fluids and also GI and surgery was consulted. She was seen by Dr. Excell Seltzer who advised no surgical recommendation initially and also recommended IV antibiotics and serial exams. Of note, she has had a recent colonoscopy as well within the last year. She was seen by [name], her outpatient NP from GI. She was maintained on the antibiotics and ultimately underwent a repeat CT of the abdomen which showed possible fistula tract. Ultimately, she underwent a barium study of the colon which showed no fistula tract. At this point GI and surgery is okay with the patient going home and the patient is eager to go home. She was challenged  with full liquid diet and currently is on schedule to start low residue diet. She has been tolerating her diet advancement. Hopefully she will be discharged later today and she will follow up with GI and surgery as an outpatient. She does have an element of chronic abdominal pain and currently she is at that level with the resolution  of the acute component.  TOTAL TIME SPENT:  35 minutes.    ____________________________ [name] sa:ce D: 01/09/2014 14:19:03 ET T: 01/09/2014 19:51:40 ET JOB#: [id number]  cc: Shannon EatonShayiq Dennette Faulconer, Dunlap, <Dictator> Joselyn ArrowKandice L. Schueller, NP [facility] CenterHAYIQ Hoover Grewe Dunlap ELECTRONICALLY SIGNED 01/22/2014 15:54

## 2015-02-13 NOTE — Consult Note (Signed)
PATIENT NAME:  [name], [name] MR#:  [id number] DATE OF BIRTH:  1976/04/09  DATE OF CONSULTATION:  01/06/2014  REQUESTING PHYSICIAN:  Dr. Jacques NavyAhmadzia CONSULTING PHYSICIAN:  Joselyn ArrowKandice L. Lyles, NP PRIMARY GASTROENTEROLOGIST: Dr. Midge Miniumarren Wohl.  PRIMARY CARE PHYSICIAN: [facility].  REASON FOR CONSULTATION: Diverticulitis.   HISTORY OF PRESENT ILLNESS: [name] is a 39 year old Caucasian female who is well known to our practice. She has a history of recurrent diverticulitis status post sigmoid colectomy with Dr. Egbert GaribaldiBird for perforated diverticulitis in December 2012. 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 She is also followed by Dr. Egbert GaribaldiBird for a ventral hernia and consideration of hernia repair and is attempting to lose weight prior to any surgical intervention. She was last seen by me early February of this year and her diarrhea had resolved at that time. She also has a fatty liver. [facility]. She had a colonoscopy 01/08/2013 by Dr. Bluford Kaufmannh which showed a few sigmoid and descending diverticula. She also had an EGD by Dr. Bluford Kaufmannh that was normal on the same date. She tells me she was doing well until March 12 when she started to have left lower quadrant abdominal pain. She had pain medicines, but they were not working at that time. She presented to the Emergency Department and was treated with Cipro and Flagyl. CT showed uncomplicated sigmoid diverticulitis. She had nausea but denies any dry heaves. Her pain was 10 out of 10 at worst. Her pain currently is right-sided as well and radiates across her abdomen. She did have a couple loose stools 3 and 4 days ago, but this has resolved. She denies any rectal bleeding, melena or mucus in her stools. She does have off-and-on indigestion with a history of GERD, but she has been noncompliant with PPI. She has been started on IV Levaquin and Flagyl since admission.  Abdominal ultrasound shows fatty liver and gallbladder sludge on 01/05/2014, bilateral kidney ultrasound show no hydronephrosis, 4 mm echogenic focus in the lower pole of the right kidney and CT scan of abdomen and pelvis with IV contrast showed focal acute sigmoid diverticulitis in the left lower quadrant, chronic hepatic steatosis, nonobstructing punctate right intrarenal calculus, 2.2 cm left ovarian cyst, chronic changes of the lower abdominal wall without significant ventral hernia.   PAST MEDICAL AND SURGICAL HISTORY: Diabetes mellitus, history of colon polyps, chronic IBS, diarrhea, recurrent diverticulitis, status post sigmoid colectomy by Dr. Egbert GaribaldiBird in December 2012, morbid obesity, ventral hernia, anxiety, depression, renal calculi, ectopic pregnancy and ankle surgery.   MEDICATIONS PRIOR TO ADMISSION: Benefiber 1 gram daily, Cipro 500 mg q. 12 hours, dicyclomine 10 mg 2 capsules up to 4 times a day, Flagyl 500 mg b.i.d., metformin 500 mg 2 tablets daily, Norco 325/5 mg q.4h. p.r.n., Zofran 4 mg p.r.n. q.8h.   ALLERGIES: MORPHINE CAUSES ITCHING. LATEX CAUSES A RASH.   FAMILY HISTORY: Diverticular disease runs in the family. Father had a CVA. There is no known colorectal carcinoma.   SOCIAL HISTORY: She denies any tobacco use currently, remote history. She denies any alcohol use. 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 She is currently a Education officer, environmentalpastor. She is single without any children.   REVIEW OF SYSTEMS: See HPI, otherwise  negative 12 point review of systems.   PHYSICAL EXAMINATION: VITAL SIGNS: Temperature 97.6, pulse 84, respirations 18, blood pressure 137/78, oxygen saturation 97% on room  air.  GENERAL: She is a morbidly obese Caucasian female who is alert, oriented, very pleasant and cooperative in no acute distress.  HEENT: Sclerae clear, anicteric. Conjunctivae pink. Oropharynx pink and moist without any lesions.  NECK: Supple without any mass or thyromegaly.  CHEST:  Heart regular rate and rhythm. Normal S1, S2. No murmurs, clicks, rubs, or gallops.  LUNGS: Clear to auscultation bilaterally.  ABDOMEN: Protuberant positive bowel sounds x 4. No bruits auscultated. She does have mild right upper quadrant tenderness on palpation, moderate left lower quadrant tenderness on palpation. There is no rebound, tenderness or guarding. No hepatosplenomegaly or mass. Exam is limited given patient's body habitus.  EXTREMITIES: With trace pretibial edema bilaterally.  SKIN: Pink, warm and dry without any rash or jaundice.  MUSCULOSKELETAL: Good equal strength and movement bilaterally.  NEUROLOGIC: Grossly intact.  PSYCHIATRIC: Alert, cooperative, normal mood and affect.   LABORATORY STUDIES: Glucose 197, sodium 134, CO2 34, calcium 8.4. Hemoglobin A1c 10.9. Lipase 107, otherwise normal BMP. LFTs are normal except albumin of 3.3. CBC is normal.   IMPRESSION: [name] is a pleasant, morbidly obese Caucasian female who is well known to our practice with history of recurrent diverticulitis status post sigmoid colectomy for perforated diverticulitis in December 2012, irritable bowel syndrome, diarrhea predominant, fatty liver, ventral hernia, and gastroesophageal reflux disease. She is admitted with uncomplicated recurrent sigmoid diverticulitis. Last colonoscopy March 2014 by Dr. Bluford Kaufmann showed diverticulosis.   PLAN: 1. Supportive measures including pain control, IV fluids and antiemetics.  2. Agree with IV Levaquin and Flagyl.  3. We will follow with you.   Thank you for allowing [place] to participate in her care.    ____________________________ Joselyn Arrow, NP klj:sg D: 01/06/2014 14:07:00 ET T: 01/06/2014 14:27:11 ET JOB#: [id number]  cc: Joselyn Arrow, NP, <Dictator> [facility]  Joselyn Arrow FNP ELECTRONICALLY SIGNED 02/10/2014 14:10

## 2015-02-14 NOTE — Discharge Summary (Signed)
PATIENT NAME:  [name], Lakeithia R MR#:  [id number] DATE OF BIRTH:  19-Oct-1976  DATE OF ADMISSION:  09/24/2011 DATE OF DISCHARGE:  10/07/2011  FINAL DIAGNOSES:  1. Contained perforation of descending colon, secondary to diverticulitis.  2. Obesity.   PRINCIPLE PROCEDURES:  1. Two CT scans of the abdomen and pelvis.  2. Descending colectomy with primary anastomosis, on 09/28/2011.   HOSPITAL COURSE SUMMARY: The patient was admitted with recurrent diverticulitis, on 09/24/2011. Despite intravenous antibiotics, bowel rest, and pain medication the patient failed to progress and repeat CT scan obtained later in her hospitalization demonstrated persistence of inflammatory changes. No distinct abscess was noted. The decision was made to take her to the operating room after appropriate counseling and preoperative preparation. She was brought to the operating room on 09/28/2011 at which point a partial colectomy of the descending colon with primary anastomosis was performed. Her remaining hospitalization was unremarkable. She had adequate pain control. On postoperative day number two she continued on PCA and was doing well. On postoperative day number three, the patient was advanced to a clear diet, but no flatus or bowel movement had been noted. On postoperative day number four, the patient continued to improve. The ileus resolved by postoperative day number six. She was not ready to come off her PCA at that point. Her abdomen remained soft and the wound was healing nicely. On postoperative day number eight, she was complaining of some intermittent incisional pain, having flatus but no bowel movement, and tolerating small amounts of regular diet. She was noted to have a small amount of serous drainage from the lower midline portion of the wound. She was transitioned over to oral narcotics. On postoperative day number nine, the patient's wound was opened, a small portion, with the lower midline intact. She was  discharged home in stable condition.   DISCHARGE MEDICATION: Norco 5/325 mg one to two tablets p.o. every four hours p.r.n. pain.   DISCHARGE FOLLOWUP AND INSTRUCTIONS: Regular diet. No heavy lifting, no more than 15 pounds. No driving while on narcotics. Follow-up in the office [place] 10[place]09/2011 [place] location. ____________________________ Redge GainerMark A. Egbert GaribaldiBird, MD mab:slb D: 10/23/2011 22:06:54 ET T: 10/25/2011 11:11:38 ET JOB#: [id number]  cc: Loraine LericheMark A. Egbert GaribaldiBird, MD, <Dictator> Raynald KempMARK A Ron Beske MD ELECTRONICALLY SIGNED 10/26/2011 8:34

## 2015-09-05 ENCOUNTER — Emergency Department
Admission: EM | Admit: 2015-09-05 | Discharge: 2015-09-05 | Disposition: A | Discharge status: Home / Self Care | Payer: Medicaid Other | Attending: Emergency Medicine | Admitting: Emergency Medicine

## 2015-09-05 ENCOUNTER — Emergency Department: Payer: Medicaid Other

## 2015-09-05 ENCOUNTER — Encounter: Payer: Self-pay | Admitting: Emergency Medicine

## 2015-09-05 DIAGNOSIS — Z7984 Long term (current) use of oral hypoglycemic drugs: Secondary | ICD-10-CM | POA: Insufficient documentation

## 2015-09-05 DIAGNOSIS — Y9289 Other specified places as the place of occurrence of the external cause: Secondary | ICD-10-CM | POA: Insufficient documentation

## 2015-09-05 DIAGNOSIS — E119 Type 2 diabetes mellitus without complications: Secondary | ICD-10-CM | POA: Insufficient documentation

## 2015-09-05 DIAGNOSIS — S6722XA Crushing injury of left hand, initial encounter: Secondary | ICD-10-CM

## 2015-09-05 DIAGNOSIS — S60222A Contusion of left hand, initial encounter: Secondary | ICD-10-CM | POA: Insufficient documentation

## 2015-09-05 DIAGNOSIS — Z91040 Latex allergy status: Secondary | ICD-10-CM | POA: Insufficient documentation

## 2015-09-05 DIAGNOSIS — Z87891 Personal history of nicotine dependence: Secondary | ICD-10-CM | POA: Insufficient documentation

## 2015-09-05 DIAGNOSIS — W208XXA Other cause of strike by thrown, projected or falling object, initial encounter: Secondary | ICD-10-CM | POA: Insufficient documentation

## 2015-09-05 DIAGNOSIS — Z79899 Other long term (current) drug therapy: Secondary | ICD-10-CM | POA: Insufficient documentation

## 2015-09-05 DIAGNOSIS — Y9389 Activity, other specified: Secondary | ICD-10-CM | POA: Insufficient documentation

## 2015-09-05 DIAGNOSIS — Y998 Other external cause status: Secondary | ICD-10-CM | POA: Insufficient documentation

## 2015-09-05 HISTORY — DX: Type 2 diabetes mellitus without complications: E11.9

## 2015-09-05 MED ORDER — HYDROMORPHONE HCL 1 MG/ML IJ SOLN
1.0000 mg | Freq: Once | INTRAMUSCULAR | Status: AC
  Administered 2015-09-05: 1 mg via INTRAMUSCULAR
  Filled 2015-09-05: qty 1

## 2015-09-05 MED ORDER — HYDROCODONE-ACETAMINOPHEN 5-325 MG PO TABS: 1.0000 | ORAL_TABLET | ORAL | Status: AC | PRN

## 2015-09-05 NOTE — Discharge Instructions (Signed)
Call tomorrow to schedule an appointment with orthopedics. Return to the ER for symptoms that change or worsen if unable to schedule an appointment with orthopedics.

## 2015-09-05 NOTE — ED Notes (Signed)
C/o left hand pain, states a tabel fell on left hand today while she was moving

## 2015-09-05 NOTE — ED Provider Notes (Signed)
[facility] Emergency Department Provider Note ____________________________________________  Time seen: Approximately 6:48 PM  I have reviewed the triage vital signs and the nursing notes.   HISTORY  Chief Complaint Hand Pain   HPI [name] is a 39 y.o. female who presents to the emergency department for evaluation of left hand pain. She states that an iron-based table fell over onto her hand earlier today she's had significant pain and swelling since then. She denies previous injury to that hand.Pain is 9 out of 10. There are no associated symptoms or modifying factors.   Past Medical History  Diagnosis Date  . Diverticulitis   . Kidney stones   . Diabetes mellitus without complication (HCC)     There are no active problems to display for this patient.   Past Surgical History  Procedure Laterality Date  . Abdominal surgery      colon resection    Current Outpatient Rx  Name  Route  Sig  Dispense  Refill  . acetaminophen (TYLENOL) 500 MG tablet   Oral   Take 1,000 mg by mouth every 6 (six) hours as needed for moderate pain.         Marland Kitchen HYDROcodone-acetaminophen (NORCO/VICODIN) 5-325 MG tablet   Oral   Take 1 tablet by mouth every 4 (four) hours as needed for moderate pain.   20 tablet   0   . metFORMIN (GLUCOPHAGE) 500 MG tablet   Oral   Take 500 mg by mouth daily with breakfast.         . ondansetron (ZOFRAN) 4 MG tablet   Oral   Take 1 tablet (4 mg total) by mouth every 6 (six) hours.   12 tablet   0   . oxyCODONE-acetaminophen (PERCOCET/ROXICET) 5-325 MG per tablet   Oral   Take 1-2 tablets by mouth every 6 (six) hours as needed for severe pain.   17 tablet   0     Allergies Morphine and related and Latex  No family history on file.  Social History Social History  Substance Use Topics  . Smoking status: Former Games developer  . Smokeless tobacco: None  . Alcohol Use: No    Review of Systems Constitutional: No  recent illness. Eyes: No visual changes. ENT: No sore throat. Cardiovascular: Denies chest pain or palpitations. Respiratory: Denies shortness of breath. Gastrointestinal: No abdominal pain.  Genitourinary: Negative for dysuria. Musculoskeletal: Pain in left hand. Skin: Negative for rash. Neurological: Negative for headaches, focal weakness or numbness. 10-point ROS otherwise negative.  ____________________________________________   PHYSICAL EXAM:  VITAL SIGNS: ED Triage Vitals  Enc Vitals Group     BP 09/05/15 1742 150/96 mmHg     Pulse Rate 09/05/15 1742 85     Resp 09/05/15 1742 16     Temp 09/05/15 1742 98 F (36.7 C)     Temp Source 09/05/15 1742 Oral     SpO2 09/05/15 1742 98 %     Weight 09/05/15 1742 200 lb (90.719 kg)     Height 09/05/15 1742  (1.499 m)     Head Cir --      Peak Flow --      Pain Score 09/05/15 1740 9     Pain Loc --      Pain Edu? --      Excl. in GC? --     Constitutional: Alert and oriented. Well appearing and in no acute distress. Eyes: Conjunctivae are normal. EOMI. Head: Atraumatic. Nose: No  congestion/rhinnorhea. Neck: No stridor.  Respiratory: Normal respiratory effort.   Musculoskeletal: Extreme snuffbox tenderness noted with large amount of swelling and ecchymosis which makes the exam difficult. No wrist pain. Limited range of motion of the left thumb due to pain. Neurologic:  Normal speech and language. No gross focal neurologic deficits are appreciated. Speech is normal. No gait instability. Skin:  Skin is warm, dry and intact. Atraumatic. Psychiatric: Mood and affect are normal. Speech and behavior are normal.  ____________________________________________   LABS (all labs ordered are listed, but only abnormal results are displayed)  Labs Reviewed - No data to display ____________________________________________  RADIOLOGY  No evidence of fracture or dislocation of the  hand. ____________________________________________   PROCEDURES  Procedure(s) performed: SPLINT APPLICATION Date/Time: 6:51 PM Authorized by: Kem Boroughsari Demia Viera Consent: Verbal consent obtained. Risks and benefits: risks, benefits and alternatives were discussed Consent given by: patient Splint applied by: Mardene CelesteJoAnna, ER Tech Location details: ** Splint type: Left hand Supplies used: OCL and ACE Post-procedure: The splinted body part was neurovascularly unchanged following the procedure. Patient tolerance: Patient tolerated the procedure well with no immediate complications.  ____________________________________________   INITIAL IMPRESSION / ASSESSMENT AND PLAN / ED COURSE  Pertinent labs & imaging results that were available during my care of the patient were reviewed by me and considered in my medical decision making (see chart for details).  Because of the amount of pain and swelling, thumb spica splint was applied. She was encouraged to call tomorrow to schedule follow-up appointment with orthopedics. She was advised to return to the emergency department for symptoms that change or worsen if she is unable to schedule an appointment. ____________________________________________   FINAL CLINICAL IMPRESSION(S) / ED DIAGNOSES  Final diagnoses:  Hand contusion, left, initial encounter  Crush injury of hand, left, initial encounter       Chinita PesterCari B Lawayne Hartig, FNP 09/05/15 1852  Jennye MoccasinBrian S Quigley, MD 09/05/15 [phone]

## 2015-09-15 ENCOUNTER — Emergency Department: Payer: Self-pay

## 2015-09-15 ENCOUNTER — Emergency Department: Payer: Medicaid Other

## 2015-09-15 ENCOUNTER — Emergency Department
Admission: EM | Admit: 2015-09-15 | Discharge: 2015-09-15 | Disposition: A | Discharge status: Home / Self Care | Payer: Self-pay | Attending: Emergency Medicine | Admitting: Emergency Medicine

## 2015-09-15 DIAGNOSIS — R1032 Left lower quadrant pain: Secondary | ICD-10-CM | POA: Insufficient documentation

## 2015-09-15 DIAGNOSIS — E119 Type 2 diabetes mellitus without complications: Secondary | ICD-10-CM | POA: Insufficient documentation

## 2015-09-15 DIAGNOSIS — Z79899 Other long term (current) drug therapy: Secondary | ICD-10-CM | POA: Insufficient documentation

## 2015-09-15 DIAGNOSIS — Z91040 Latex allergy status: Secondary | ICD-10-CM | POA: Insufficient documentation

## 2015-09-15 DIAGNOSIS — R1084 Generalized abdominal pain: Secondary | ICD-10-CM | POA: Insufficient documentation

## 2015-09-15 DIAGNOSIS — Z87891 Personal history of nicotine dependence: Secondary | ICD-10-CM | POA: Insufficient documentation

## 2015-09-15 DIAGNOSIS — X58XXXD Exposure to other specified factors, subsequent encounter: Secondary | ICD-10-CM | POA: Insufficient documentation

## 2015-09-15 DIAGNOSIS — R112 Nausea with vomiting, unspecified: Secondary | ICD-10-CM | POA: Insufficient documentation

## 2015-09-15 DIAGNOSIS — R1030 Lower abdominal pain, unspecified: Secondary | ICD-10-CM

## 2015-09-15 DIAGNOSIS — S60222D Contusion of left hand, subsequent encounter: Secondary | ICD-10-CM | POA: Insufficient documentation

## 2015-09-15 DIAGNOSIS — Z3202 Encounter for pregnancy test, result negative: Secondary | ICD-10-CM | POA: Insufficient documentation

## 2015-09-15 HISTORY — DX: Unspecified ovarian cyst, left side: N83.202

## 2015-09-15 HISTORY — DX: Unspecified ovarian cyst, right side: N83.201

## 2015-09-15 LAB — COMPREHENSIVE METABOLIC PANEL
ALBUMIN: 3.7 g/dL (ref 3.5–5.0)
ALT: 18 U/L (ref 14–54)
AST: 16 U/L (ref 15–41)
Alkaline Phosphatase: 79 U/L (ref 38–126)
Anion gap: 8 (ref 5–15)
BUN: 19 mg/dL (ref 6–20)
CO2: 26 mmol/L (ref 22–32)
Calcium: 9.2 mg/dL (ref 8.9–10.3)
Chloride: 104 mmol/L (ref 101–111)
Creatinine, Ser: 1.14 mg/dL — ABNORMAL HIGH (ref 0.44–1.00)
GFR calc Af Amer: >60 mL/min (ref >60)
GFR calc non Af Amer: 59 mL/min — ABNORMAL LOW (ref >60)
GLUCOSE: 203 mg/dL — AB (ref 65–99)
Potassium: 3.9 mmol/L (ref 3.5–5.1)
SODIUM: 138 mmol/L (ref 135–145)
Total Bilirubin: 0.2 mg/dL — ABNORMAL LOW (ref 0.3–1.2)
Total Protein: 7.5 g/dL (ref 6.5–8.1)

## 2015-09-15 LAB — CBC WITH DIFFERENTIAL/PLATELET
BASOS PCT: 1 %
Basophils Absolute: 0.1 10*3/uL (ref 0–0.1)
EOS ABS: 0.1 10*3/uL (ref 0–0.7)
EOS PCT: 1 %
HCT: 42.3 % (ref 35.0–47.0)
Hemoglobin: 13.9 g/dL (ref 12.0–16.0)
Lymphocytes Relative: 27 %
Lymphs Abs: 3.5 10*3/uL (ref 1.0–3.6)
MCH: 30.2 pg (ref 26.0–34.0)
MCHC: 32.8 g/dL (ref 32.0–36.0)
MCV: 92.2 fL (ref 80.0–100.0)
MONO ABS: 0.4 10*3/uL (ref 0.2–0.9)
MONOS PCT: 3 %
Neutro Abs: 8.7 10*3/uL — ABNORMAL HIGH (ref 1.4–6.5)
Neutrophils Relative %: 68 %
PLATELETS: 245 10*3/uL (ref 150–440)
RBC: 4.59 MIL/uL (ref 3.80–5.20)
RDW: 13.3 % (ref 11.5–14.5)
WBC: 12.8 10*3/uL — ABNORMAL HIGH (ref 3.6–11.0)

## 2015-09-15 LAB — URINALYSIS COMPLETE WITH MICROSCOPIC (ARMC ONLY)
BACTERIA UA: NONE SEEN
BILIRUBIN URINE: NEGATIVE
GLUCOSE, UA: NEGATIVE mg/dL
HGB URINE DIPSTICK: NEGATIVE
KETONES UR: NEGATIVE mg/dL
LEUKOCYTES UA: NEGATIVE
Nitrite: NEGATIVE
PH: 5 (ref 5.0–8.0)
Protein, ur: NEGATIVE mg/dL
Specific Gravity, Urine: 1.019 (ref 1.005–1.030)

## 2015-09-15 LAB — POCT PREGNANCY, URINE: PREG TEST UR: NEGATIVE

## 2015-09-15 LAB — LIPASE, BLOOD: Lipase: 25 U/L (ref 11–51)

## 2015-09-15 MED ORDER — IOHEXOL 240 MG/ML SOLN
25.0000 mL | Freq: Once | INTRAMUSCULAR | Status: AC | PRN
  Administered 2015-09-15: 50 mL via ORAL

## 2015-09-15 MED ORDER — CIPROFLOXACIN HCL 500 MG PO TABS
500.0000 mg | ORAL_TABLET | ORAL | Status: AC
  Administered 2015-09-15: 500 mg via ORAL

## 2015-09-15 MED ORDER — CIPROFLOXACIN HCL 500 MG PO TABS
ORAL_TABLET | ORAL | Status: AC
  Administered 2015-09-15: 500 mg via ORAL
  Filled 2015-09-15: qty 1

## 2015-09-15 MED ORDER — OXYCODONE-ACETAMINOPHEN 5-325 MG PO TABS
2.0000 | ORAL_TABLET | Freq: Once | ORAL | Status: AC
  Administered 2015-09-15: 2 via ORAL

## 2015-09-15 MED ORDER — METRONIDAZOLE 500 MG PO TABS: 500.0000 mg | ORAL_TABLET | Freq: Three times a day (TID) | ORAL | Status: AC

## 2015-09-15 MED ORDER — IOHEXOL 350 MG/ML SOLN
100.0000 mL | Freq: Once | INTRAVENOUS | Status: AC | PRN
  Administered 2015-09-15: 100 mL via INTRAVENOUS

## 2015-09-15 MED ORDER — METRONIDAZOLE 500 MG PO TABS
500.0000 mg | ORAL_TABLET | Freq: Once | ORAL | Status: AC
  Administered 2015-09-15: 500 mg via ORAL

## 2015-09-15 MED ORDER — METRONIDAZOLE 500 MG PO TABS
ORAL_TABLET | ORAL | Status: AC
  Filled 2015-09-15: qty 1

## 2015-09-15 MED ORDER — CIPROFLOXACIN HCL 500 MG PO TABS: 500.0000 mg | ORAL_TABLET | Freq: Two times a day (BID) | ORAL | Status: AC

## 2015-09-15 MED ORDER — DOCUSATE SODIUM 100 MG PO CAPS: ORAL_CAPSULE | ORAL | Status: AC

## 2015-09-15 MED ORDER — ONDANSETRON HCL 4 MG/2ML IJ SOLN
4.0000 mg | INTRAMUSCULAR | Status: AC
  Administered 2015-09-15: 4 mg via INTRAVENOUS
  Filled 2015-09-15: qty 2

## 2015-09-15 MED ORDER — OXYCODONE-ACETAMINOPHEN 5-325 MG PO TABS
ORAL_TABLET | ORAL | Status: DC
Start: 2015-09-15 — End: 2015-09-15
  Filled 2015-09-15: qty 2

## 2015-09-15 MED ORDER — MORPHINE SULFATE (PF) 4 MG/ML IV SOLN
4.0000 mg | Freq: Once | INTRAVENOUS | Status: AC
  Administered 2015-09-15: 4 mg via INTRAVENOUS
  Filled 2015-09-15: qty 1

## 2015-09-15 MED ORDER — OXYCODONE-ACETAMINOPHEN 5-325 MG PO TABS: 1.0000 | ORAL_TABLET | ORAL | Status: DC | PRN

## 2015-09-15 NOTE — Discharge Instructions (Signed)
1)  Abdominal pain - possibly caused by early diverticulitis.  We have prescribed two antibiotics, and you should take the full course of both.  Follow up with Dr. Tonita CongWoodham or another surgery. 2)  Hand pain - [facility] must [facility] and have them [facility] a [facility] to [facility], then you will be able to schedule a follow up visit with Dr. Rosita KeaMenz.  Fortunately your repeat x-rays were normal, so you can also follow up just with [facility] if you prefer.  Please return immediately to the Emergency Department if you develop any new or worsening symptoms that concern you.   Abdominal Pain, Adult Many things can cause abdominal pain. Usually, abdominal pain is not caused by a disease and will improve without treatment. It can often be observed and treated at home. Your health care provider will do a physical exam and possibly order blood tests and X-rays to help determine the seriousness of your pain. However, in many cases, more time must pass before a clear cause of the pain can be found. Before that point, your health care provider may not know if you need more testing or further treatment. HOME CARE INSTRUCTIONS Monitor your abdominal pain for any changes. The following actions may help to alleviate any discomfort you are experiencing:  Only take over-the-counter or prescription medicines as directed by your health care provider.  Do not take laxatives unless directed to do so by your health care provider.  Try a clear liquid diet (broth, tea, or water) as directed by your health care provider. Slowly move to a bland diet as tolerated. SEEK MEDICAL CARE IF:  You have unexplained abdominal pain.  You have abdominal pain associated with nausea or diarrhea.  You have pain when you urinate or have a bowel movement.  You experience abdominal pain that wakes you in the night.  You have abdominal pain that is worsened or improved by eating food.  You have abdominal pain that is  worsened with eating fatty foods.  You have a fever. SEEK IMMEDIATE MEDICAL CARE IF:  Your pain does not go away within 2 hours.  You keep throwing up (vomiting).  Your pain is felt only in portions of the abdomen, such as the right side or the left lower portion of the abdomen.  You pass bloody or black tarry stools. MAKE SURE YOU:  Understand these instructions.  Will watch your condition.  Will get help right away if you are not doing well or get worse.   This information is not intended to replace advice given to you by your health care provider. Make sure you discuss any questions you have with your health care provider.   Document Released: 07/19/2005 Document Revised: 06/30/2015 Document Reviewed: 06/18/2013 Elsevier Interactive Patient Education [employer].

## 2015-09-15 NOTE — ED Notes (Signed)
Pt c/o lower abd pain with nausea for the past 3 days.. Denies vomiting or diarrhea.

## 2015-09-15 NOTE — ED Notes (Signed)
When Dr York CeriseForbach was at bedside to discuss d/c and follow-up, pt stated that she was not able to get follow up appt with ortho for another two weeks. Hand x-ray ordered by Dr York CeriseForbach.  Splint was removed.  Hand is still swollen but the swelling has decreased per patient.   Healing bruises noted to hand

## 2015-09-15 NOTE — ED Notes (Signed)
Pts call bell is within reach, family at bedside.  She was just medicated as ordered. Pt advised to call nurse immediately for any concerns about medication reaction.

## 2015-09-15 NOTE — ED Notes (Signed)
Pt in CT.

## 2015-09-15 NOTE — ED Provider Notes (Signed)
[facility] Emergency Department Provider Note  ____________________________________________  Time seen: Approximately 4:52 PM  I have reviewed the triage vital signs and the nursing notes.   HISTORY  Chief Complaint Abdominal Pain    HPI [name] is a 39 y.o. female with a past medical history that includes diverticulitis status post colectomy several years ago who presents with nausea for 3 days and gradual onset of severe abdominal pain sinceyesterday.  Nothing makes it better and movement makes it worse.  He does not identify a specific location and states that the pain is generalized, although she said that it is sometimes worse on the left.  She also feels distended.  She has had several episodes of emesis but primarily just persistent nausea.  She says it feels similar to when she had diverticulitis in the past.  She has a recent injury to her left hand for which she is being managed by orthopedics but otherwise she has no other complaints.  Specifically she denies fever/chills, chest pain, shortness of breath, dysuria.   Past Medical History  Diagnosis Date  . Diverticulitis   . Kidney stones   . Diabetes mellitus without complication (HCC)   . Bilateral ovarian cysts     There are no active problems to display for this patient.   Past Surgical History  Procedure Laterality Date  . Abdominal surgery      colon resection  . Ankle surgery    . Ectopic pregnancy surgery      Current Outpatient Rx  Name  Route  Sig  Dispense  Refill  . acetaminophen (TYLENOL) 500 MG tablet   Oral   Take 1,000 mg by mouth every 6 (six) hours as needed for moderate pain.         . ciprofloxacin (CIPRO) 500 MG tablet   Oral   Take 1 tablet (500 mg total) by mouth 2 (two) times daily.   20 tablet   0   . docusate sodium (COLACE) 100 MG capsule      Take 1 tablet once or twice daily as needed for constipation while taking narcotic pain medicine    30 capsule   0   . HYDROcodone-acetaminophen (NORCO/VICODIN) 5-325 MG tablet   Oral   Take 1 tablet by mouth every 4 (four) hours as needed for moderate pain.   20 tablet   0   . metFORMIN (GLUCOPHAGE) 500 MG tablet   Oral   Take 500 mg by mouth daily with breakfast.         . metroNIDAZOLE (FLAGYL) 500 MG tablet   Oral   Take 1 tablet (500 mg total) by mouth 3 (three) times daily.   30 tablet   0   . ondansetron (ZOFRAN) 4 MG tablet   Oral   Take 1 tablet (4 mg total) by mouth every 6 (six) hours.   12 tablet   0   . oxyCODONE-acetaminophen (ROXICET) 5-325 MG tablet   Oral   Take 1-2 tablets by mouth every 4 (four) hours as needed for severe pain.   20 tablet   0     Allergies Morphine and related and Latex  No family history on file.  Social History Social History  Substance Use Topics  . Smoking status: Former Smoker    Quit date: 09/15/2011  . Smokeless tobacco: None  . Alcohol Use: No    Review of Systems Constitutional: No fever/chills Eyes: No visual changes. ENT: No sore throat. Cardiovascular:  Denies chest pain. Respiratory: Denies shortness of breath. Gastrointestinal: Generalized abdominal pain worse on the left lower quadrant.  Persistent nausea with several episodes of emesis.  No bowel changes.. Genitourinary: Negative for dysuria. Musculoskeletal: Negative for back pain. Skin: Negative for rash. Neurological: Negative for headaches, focal weakness or numbness.  10-point ROS otherwise negative.  ____________________________________________   PHYSICAL EXAM:  VITAL SIGNS: ED Triage Vitals  Enc Vitals Group     BP 09/15/15 1618 131/86 mmHg     Pulse Rate 09/15/15 1618 102     Resp 09/15/15 1618 18     Temp 09/15/15 1618 98.5 F (36.9 C)     Temp Source 09/15/15 1618 Oral     SpO2 09/15/15 1618 97 %     Weight 09/15/15 1618 200 lb (90.719 kg)     Height 09/15/15 1618 4\' 11"  (1.499 m)     Head Cir --      Peak Flow --       Pain Score 09/15/15 1618 8     Pain Loc --      Pain Edu? --      Excl. in GC? --     Constitutional: Alert and oriented. Well appearing and in no acute distress. Eyes: Conjunctivae are normal. PERRL. EOMI. Head: Atraumatic. Nose: No congestion/rhinnorhea. Mouth/Throat: Mucous membranes are moist.  Oropharynx non-erythematous. Neck: No stridor.   Cardiovascular: Normal rate, regular rhythm. Grossly normal heart sounds.  Good peripheral circulation. Respiratory: Normal respiratory effort.  No retractions. Lungs CTAB. Gastrointestinal: Soft, nondistended tenderness throughout with some distention and tympany.  Extensive old abdominal scars that are well appearing. Musculoskeletal: The patient has an old thumb spica splint on her left hand and continues to complain of pain and tenderness.  We removed the splint and she has some subacute bruising and tenderness but otherwise good range of motion and neurovascularly intact. Neurologic:  Normal speech and language. No gross focal neurologic deficits are appreciated.  Skin:  Skin is warm, dry and intact. No rash noted. Psychiatric: Mood and affect are normal. Speech and behavior are normal.  ____________________________________________   LABS (all labs ordered are listed, but only abnormal results are displayed)  Labs Reviewed  URINALYSIS COMPLETEWITH MICROSCOPIC ([facility] ONLY) - Abnormal; Notable for the following:    Color, Urine YELLOW (*)    APPearance CLEAR (*)    Squamous Epithelial / LPF 0-5 (*)    All other components within normal limits  CBC WITH DIFFERENTIAL/PLATELET - Abnormal; Notable for the following:    WBC 12.8 (*)    Neutro Abs 8.7 (*)    All other components within normal limits  COMPREHENSIVE METABOLIC PANEL - Abnormal; Notable for the following:    Glucose, Bld 203 (*)    Creatinine, Ser 1.14 (*)    Total Bilirubin 0.2 (*)    GFR calc non Af Amer 59 (*)    All other components within normal limits  LIPASE, BLOOD   POC URINE PREG, ED  POCT PREGNANCY, URINE   ____________________________________________  EKG  Not indicated ____________________________________________  RADIOLOGY   Ct Abdomen Pelvis W Contrast  09/15/2015  CLINICAL DATA:  Abdominal pain with nausea for 3 days EXAM: CT ABDOMEN AND PELVIS WITH CONTRAST TECHNIQUE: Multidetector CT imaging of the abdomen and pelvis was performed using the standard protocol following bolus administration of intravenous contrast. Oral contrast was also administered. CONTRAST:  100mL OMNIPAQUE IOHEXOL 350 MG/ML SOLN COMPARISON:  February 09, 2015 FINDINGS: Lower chest:  Lung bases are clear.  Hepatobiliary: Liver is prominent, measuring 21.2 cm in length. 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 No focal liver lesions are identified. The gallbladder wall is not appreciably thickened. There is no biliary duct dilatation. Pancreas: No pancreatic mass or inflammatory focus appreciable. Spleen: No demonstrable splenic lesion. Adrenals/Urinary Tract: Adrenals appear normal bilaterally. There is a 1 mm nonobstructing calculus mid right kidney. There is mild scarring in the right kidney, stable. There is no renal mass or hydronephrosis on either side. There is no ureteral calculus on either side. Urinary bladder is midline with wall thickness within normal limits. Stomach/Bowel: There are scattered sigmoid diverticulosis. Mild wall thickening in the proximal sigmoid colon is again noted, likely due to muscular hypertrophy from diverticulosis. There is no surrounding mesenteric thickening or stranding to indicate overt diverticulitis. There is no other evidence of bowel wall thickening. No mesenteric thickening is seen on this study. No bowel obstruction. No free air or portal venous air. Vascular/Lymphatic: There is no abdominal aortic aneurysm. No vascular lesions are appreciable. The major mesenteric vessels appear widely patent. There is no ascites in the abdomen or pelvis.  Reproductive: Uterus is anteverted. No intrauterine mass is appreciable by CT. There is an apparent nabothian cyst in the cervix measuring 1.2 x 1.1 cm. There is no other pelvic mass. No pelvic fluid collection. Other: Appendix appears normal. No abscess or ascites in the abdomen or pelvis. There is a small left paramedian ventral hernia containing only fat. Musculoskeletal: Scarring in the anterior abdominal wall is stable. There are no blastic or lytic bone lesions. There are no intramuscular lesions. IMPRESSION: There is again noted wall thickening in the proximal sigmoid colon. No surrounding inflammation. Suspect localized muscular hypertrophy from diverticulosis. Earliest changes of diverticulitis in this area are possible, however. No bowel obstruction.  No abscess.  Appendix appears normal. Small left ventral hernia containing only fat. 1 mm nonobstructing calculus mid right kidney. No hydronephrosis on either side. No ureteral calculi on either side. Nabothian cyst in cervix, a benign finding. Electronically Signed   By: Bretta Bang III M.D.   On: 09/15/2015 18:59   Dg Hand Complete Left  09/15/2015  CLINICAL DATA:  Left thumb pain after crush injury 10 days ago. EXAM: LEFT HAND - COMPLETE 3+ VIEW COMPARISON:  September 05, 2015. FINDINGS: There is no evidence of fracture or dislocation. There is no evidence of arthropathy or other focal bone abnormality. Soft tissues are unremarkable. IMPRESSION: Normal left hand. Electronically Signed   By: Lupita Raider, M.D.   On: 09/15/2015 19:49    ____________________________________________   PROCEDURES  Procedure(s) performed: None  Critical Care performed: No ____________________________________________   INITIAL IMPRESSION / ASSESSMENT AND PLAN / ED COURSE  Pertinent labs & imaging results that were available during my care of the patient were reviewed by me and considered in my medical decision making (see chart for details).  I am  concerned about the possibility of SBO although additional diverticular disease is possible.  We will treat with pain and nausea medicine and evaluated with a CT scan and basic lab work.  ----------------------------------------- 7:59 PM on 09/15/2015 -----------------------------------------  Patient CT scan was reassuring except for an area of thickening that may represent early diverticulitis.  Given her history I will treat her empirically with antibiotics as well as with pain medicine.  I am suggesting that she follow up with surgery.  Additionally, I spoke with Dr. Rosita Kea about the patient's inability to get an appointment in his  clinic, and he explained that her primary care doctor must send a referral for her follow-up.  I removed her splint and obtain repeat radiographs which were reassuring that there is no new bone growth to suggest a fracture.  I am putting her in a wrist splint and recommending outpatient follow-up either withdrawal is true or with Dr. Rosita Kea and explained the need for her to request a referral to the orthopedic surgeon from her PCP.  She understands and agrees with all of the plans as outlined above and in her discharge instructions.  ____________________________________________  FINAL CLINICAL IMPRESSION(S) / ED DIAGNOSES  Final diagnoses:  Lower abdominal pain  Non-intractable vomiting with nausea, vomiting of unspecified type  Hand contusion, left, subsequent encounter      NEW MEDICATIONS STARTED DURING THIS VISIT:  New Prescriptions   CIPROFLOXACIN (CIPRO) 500 MG TABLET    Take 1 tablet (500 mg total) by mouth 2 (two) times daily.   DOCUSATE SODIUM (COLACE) 100 MG CAPSULE    Take 1 tablet once or twice daily as needed for constipation while taking narcotic pain medicine   METRONIDAZOLE (FLAGYL) 500 MG TABLET    Take 1 tablet (500 mg total) by mouth 3 (three) times daily.   OXYCODONE-ACETAMINOPHEN (ROXICET) 5-325 MG TABLET    Take 1-2 tablets by mouth every 4  (four) hours as needed for severe pain.     Loleta Rose, MD 09/15/15 2001

## 2015-09-15 NOTE — ED Notes (Signed)
Pt has morphine ordered for pain, but pt states that she is allergic to morphine.   She states that it makes her "itch like crazy"   This info was given to Dr. York CeriseForbach, but he still advised RN to proceed with the medication as ordered.

## 2015-09-19 ENCOUNTER — Encounter: Payer: Self-pay | Admitting: Emergency Medicine

## 2015-09-19 ENCOUNTER — Emergency Department
Admission: EM | Admit: 2015-09-19 | Discharge: 2015-09-19 | Disposition: A | Discharge status: Home / Self Care | Payer: Medicaid Other | Attending: Emergency Medicine | Admitting: Emergency Medicine

## 2015-09-19 DIAGNOSIS — E119 Type 2 diabetes mellitus without complications: Secondary | ICD-10-CM | POA: Insufficient documentation

## 2015-09-19 DIAGNOSIS — R1084 Generalized abdominal pain: Secondary | ICD-10-CM

## 2015-09-19 DIAGNOSIS — Z79899 Other long term (current) drug therapy: Secondary | ICD-10-CM | POA: Insufficient documentation

## 2015-09-19 DIAGNOSIS — N946 Dysmenorrhea, unspecified: Secondary | ICD-10-CM | POA: Insufficient documentation

## 2015-09-19 DIAGNOSIS — Z91040 Latex allergy status: Secondary | ICD-10-CM | POA: Insufficient documentation

## 2015-09-19 DIAGNOSIS — Z7984 Long term (current) use of oral hypoglycemic drugs: Secondary | ICD-10-CM | POA: Insufficient documentation

## 2015-09-19 DIAGNOSIS — Z792 Long term (current) use of antibiotics: Secondary | ICD-10-CM | POA: Insufficient documentation

## 2015-09-19 DIAGNOSIS — Z3202 Encounter for pregnancy test, result negative: Secondary | ICD-10-CM | POA: Insufficient documentation

## 2015-09-19 LAB — CBC WITH DIFFERENTIAL/PLATELET
BASOS PCT: 1 %
Basophils Absolute: 0.1 10*3/uL (ref 0–0.1)
EOS ABS: 0 10*3/uL (ref 0–0.7)
EOS PCT: 0 %
HCT: 42.8 % (ref 35.0–47.0)
Hemoglobin: 14.2 g/dL (ref 12.0–16.0)
LYMPHS ABS: 2.7 10*3/uL (ref 1.0–3.6)
Lymphocytes Relative: 22 %
MCH: 30.1 pg (ref 26.0–34.0)
MCHC: 33.3 g/dL (ref 32.0–36.0)
MCV: 90.5 fL (ref 80.0–100.0)
Monocytes Absolute: 0.5 10*3/uL (ref 0.2–0.9)
Monocytes Relative: 4 %
Neutro Abs: 8.9 10*3/uL — ABNORMAL HIGH (ref 1.4–6.5)
Neutrophils Relative %: 73 %
PLATELETS: 261 10*3/uL (ref 150–440)
RBC: 4.73 MIL/uL (ref 3.80–5.20)
RDW: 13.3 % (ref 11.5–14.5)
WBC: 12.4 10*3/uL — AB (ref 3.6–11.0)

## 2015-09-19 LAB — COMPREHENSIVE METABOLIC PANEL
ALBUMIN: 3.9 g/dL (ref 3.5–5.0)
ALT: 20 U/L (ref 14–54)
ANION GAP: 7 (ref 5–15)
AST: 16 U/L (ref 15–41)
Alkaline Phosphatase: 86 U/L (ref 38–126)
BUN: 17 mg/dL (ref 6–20)
CHLORIDE: 103 mmol/L (ref 101–111)
CO2: 28 mmol/L (ref 22–32)
CREATININE: 1.1 mg/dL — AB (ref 0.44–1.00)
Calcium: 9.2 mg/dL (ref 8.9–10.3)
GFR calc non Af Amer: >60 mL/min (ref >60)
Glucose, Bld: 142 mg/dL — ABNORMAL HIGH (ref 65–99)
Potassium: 4.2 mmol/L (ref 3.5–5.1)
SODIUM: 138 mmol/L (ref 135–145)
Total Bilirubin: 0.5 mg/dL (ref 0.3–1.2)
Total Protein: 7.9 g/dL (ref 6.5–8.1)

## 2015-09-19 LAB — POCT PREGNANCY, URINE: PREG TEST UR: NEGATIVE

## 2015-09-19 LAB — LIPASE, BLOOD: Lipase: 22 U/L (ref 11–51)

## 2015-09-19 MED ORDER — DIAZEPAM 5 MG PO TABS
5.0000 mg | ORAL_TABLET | Freq: Once | ORAL | Status: AC
  Administered 2015-09-19: 5 mg via ORAL
  Filled 2015-09-19: qty 1

## 2015-09-19 MED ORDER — KETOROLAC TROMETHAMINE 30 MG/ML IJ SOLN
30.0000 mg | Freq: Once | INTRAMUSCULAR | Status: AC
  Administered 2015-09-19: 30 mg via INTRAVENOUS
  Filled 2015-09-19: qty 1

## 2015-09-19 MED ORDER — DIAZEPAM 5 MG PO TABS: 5.0000 mg | ORAL_TABLET | Freq: Three times a day (TID) | ORAL | Status: AC | PRN

## 2015-09-19 MED ORDER — NAPROXEN 500 MG PO TABS: 500.0000 mg | ORAL_TABLET | Freq: Two times a day (BID) | ORAL | Status: AC

## 2015-09-19 NOTE — ED Notes (Signed)
Urine preg negative

## 2015-09-19 NOTE — ED Provider Notes (Signed)
[facility] Emergency Department Provider Note  ____________________________________________  Time seen: 4:00 PM  I have reviewed the triage vital signs and the nursing notes.   HISTORY  Chief Complaint Abdominal Pain    HPI [name] is a 39 y.o. female who complains of persistent crampy generalized abdominal pain for the last 5 days. Gradual onset, waxes and wanes but severe at its worst. She was seen in the emergency department4 days ago and underwent a complete workup including CT scan which showed possible early diverticulitis. She was started on antibiotics and given Percocet. She states that she started all these medicines yesterday but the Percocet makes her itchy. She is concerned that her diverticulitis might be worsening. She also complains of some bloating of the abdomen. She did start her period today, and this is the first real period that she has had in a year.     Past Medical History  Diagnosis Date  . Diverticulitis   . Kidney stones   . Diabetes mellitus without complication (HCC)   . Bilateral ovarian cysts      There are no active problems to display for this patient.    Past Surgical History  Procedure Laterality Date  . Abdominal surgery      colon resection  . Ankle surgery    . Ectopic pregnancy surgery    . Colectomy       Current Outpatient Rx  Name  Route  Sig  Dispense  Refill  . acetaminophen (TYLENOL) 500 MG tablet   Oral   Take 1,000 mg by mouth every 6 (six) hours as needed for moderate pain.         . ciprofloxacin (CIPRO) 500 MG tablet   Oral   Take 1 tablet (500 mg total) by mouth 2 (two) times daily.   20 tablet   0   . diazepam (VALIUM) 5 MG tablet   Oral   Take 1 tablet (5 mg total) by mouth every 8 (eight) hours as needed for muscle spasms.   8 tablet   0   . docusate sodium (COLACE) 100 MG capsule      Take 1 tablet once or twice daily as needed for constipation while taking  narcotic pain medicine   30 capsule   0   . HYDROcodone-acetaminophen (NORCO/VICODIN) 5-325 MG tablet   Oral   Take 1 tablet by mouth every 4 (four) hours as needed for moderate pain.   20 tablet   0   . metFORMIN (GLUCOPHAGE) 500 MG tablet   Oral   Take 500 mg by mouth daily with breakfast.         . metroNIDAZOLE (FLAGYL) 500 MG tablet   Oral   Take 1 tablet (500 mg total) by mouth 3 (three) times daily.   30 tablet   0   . naproxen (NAPROSYN) 500 MG tablet   Oral   Take 1 tablet (500 mg total) by mouth 2 (two) times daily with a meal.   20 tablet   0   . ondansetron (ZOFRAN) 4 MG tablet   Oral   Take 1 tablet (4 mg total) by mouth every 6 (six) hours.   12 tablet   0   . oxyCODONE-acetaminophen (ROXICET) 5-325 MG tablet   Oral   Take 1-2 tablets by mouth every 4 (four) hours as needed for severe pain.   20 tablet   0      Allergies Morphine and related and Latex  No family history on file.  Social History Social History  Substance Use Topics  . Smoking status: Former Smoker    Quit date: 09/15/2011  . Smokeless tobacco: None  . Alcohol Use: No    Review of Systems  Constitutional:   No fever or chills. No weight changes Eyes:   No blurry vision or double vision.  ENT:   No sore throat. Cardiovascular:   No chest pain. Respiratory:   No dyspnea or cough. Gastrointestinal:   Positive as above for abdominal pain, without vomiting and diarrhea.  No BRBPR or melena. Genitourinary:   Negative for dysuria, urinary retention, bloody urine, or difficulty urinating. Musculoskeletal:   Negative for back pain. No joint swelling or pain. Skin:   Negative for rash. Neurological:   Negative for headaches, focal weakness or numbness. Psychiatric:  No anxiety or depression.   Endocrine:  No hot/cold intolerance, changes in energy, or sleep difficulty.  10-point ROS otherwise negative.  ____________________________________________   PHYSICAL  EXAM:  VITAL SIGNS: ED Triage Vitals  Enc Vitals Group     BP 09/19/15 1504 125/84 mmHg     Pulse Rate 09/19/15 1504 99     Resp 09/19/15 1504 18     Temp 09/19/15 1504 98.1 F (36.7 C)     Temp Source 09/19/15 1504 Oral     SpO2 09/19/15 1504 99 %     Weight 09/19/15 1504 200 lb (90.719 kg)     Height 09/19/15 1504  (1.499 m)     Head Cir --      Peak Flow --      Pain Score 09/19/15 1505 9     Pain Loc --      Pain Edu? --      Excl. in GC? --      Constitutional:   Alert and oriented. Well appearing and in no distress. Eyes:   No scleral icterus. No conjunctival pallor. PERRL. EOMI ENT   Head:   Normocephalic and atraumatic.   Nose:   No congestion/rhinnorhea. No septal hematoma   Mouth/Throat:   MMM, no pharyngeal erythema. No peritonsillar mass. No uvula shift.   Neck:   No stridor. No SubQ emphysema. No meningismus. Hematological/Lymphatic/Immunilogical:   No cervical lymphadenopathy. Cardiovascular:   RRR. Normal and symmetric distal pulses are present in all extremities. No murmurs, rubs, or gallops. Respiratory:   Normal respiratory effort without tachypnea nor retractions. Breath sounds are clear and equal bilaterally. No wheezes/rales/rhonchi. Gastrointestinal:   Soft and nontender. Mild distention. There is no CVA tenderness.  No rebound, rigidity, or guarding. Genitourinary:   deferred Musculoskeletal:   Nontender with normal range of motion in all extremities. No joint effusions.  No lower extremity tenderness.  No edema. Neurologic:   Normal speech and language.  CN 2-10 normal. Motor grossly intact. No pronator drift.  Normal gait. No gross focal neurologic deficits are appreciated.  Skin:    Skin is warm, dry and intact. No rash noted.  No petechiae, purpura, or bullae. Psychiatric:   Mood and affect are normal. Speech and behavior are normal. Patient exhibits appropriate insight and  judgment.  ____________________________________________    LABS (pertinent positives/negatives) (all labs ordered are listed, but only abnormal results are displayed) Labs Reviewed  CBC WITH DIFFERENTIAL/PLATELET - Abnormal; Notable for the following:    WBC 12.4 (*)    Neutro Abs 8.9 (*)    All other components within normal limits  COMPREHENSIVE METABOLIC PANEL - Abnormal; Notable for  the following:    Glucose, Bld 142 (*)    Creatinine, Ser 1.10 (*)    All other components within normal limits  LIPASE, BLOOD   ____________________________________________   EKG    ____________________________________________    RADIOLOGY    ____________________________________________   PROCEDURES   ____________________________________________   INITIAL IMPRESSION / ASSESSMENT AND PLAN / ED COURSE  Pertinent labs & imaging results that were available during my care of the patient were reviewed by me and considered in my medical decision making (see chart for details).  Vital signs are stable and normal. Repeat lab work today is all unremarkable. Her symptoms and exam are consistent with dysmenorrhea, and a low suspicion for acute worsening of diverticulitis, perforation or obstruction, sepsis torsion or ectopic. We'll give Toradol and Valium for her symptoms and plan for outpatient follow-up. We'll double check her urine pregnancy     ____________________________________________   FINAL CLINICAL IMPRESSION(S) / ED DIAGNOSES  Final diagnoses:  Generalized abdominal pain  Dysmenorrhea      Sharman CheekPhillip Murna Backer, MD 09/19/15 [phone]

## 2015-09-19 NOTE — ED Notes (Signed)
Abdominal pain x 5 days. States was seen here previous with diagnosis early diverticulitis. States did start menses today.

## 2015-09-19 NOTE — Discharge Instructions (Signed)
You were prescribed a medication that is potentially sedating. Do not drink alcohol, drive or participate in any other potentially dangerous activities while taking this medication as it may make you sleepy. Do not take this medication with any other sedating medications, either prescription or over-the-counter. If you were prescribed Percocet or Vicodin, do not take these with acetaminophen (Tylenol) as it is already contained within these medications.   Opioid pain medications (or "narcotics") can be habit forming.  Use it as little as possible to achieve adequate pain control.  Do not use or use it with extreme caution if you have a history of opiate abuse or dependence.  If you are on a pain contract with your primary care doctor or a pain specialist, be sure to let them know you were prescribed this medication today from the [facility].  This medication is intended for your use only - do not give any to anyone else and keep it in a secure place where nobody else, especially children and pets, have access to it.  It will also cause or worsen constipation, so you may want to consider taking an over-the-counter stool softener while you are taking this medication.  Abdominal Pain, Adult Many things can cause abdominal pain. Usually, abdominal pain is not caused by a disease and will improve without treatment. It can often be observed and treated at home. Your health care provider will do a physical exam and possibly order blood tests and X-rays to help determine the seriousness of your pain. However, in many cases, more time must pass before a clear cause of the pain can be found. Before that point, your health care provider may not know if you need more testing or further treatment. HOME CARE INSTRUCTIONS Monitor your abdominal pain for any changes. The following actions may help to alleviate any discomfort you are experiencing:  Only take over-the-counter or prescription  medicines as directed by your health care provider.  Do not take laxatives unless directed to do so by your health care provider.  Try a clear liquid diet (broth, tea, or water) as directed by your health care provider. Slowly move to a bland diet as tolerated. SEEK MEDICAL CARE IF:  You have unexplained abdominal pain.  You have abdominal pain associated with nausea or diarrhea.  You have pain when you urinate or have a bowel movement.  You experience abdominal pain that wakes you in the night.  You have abdominal pain that is worsened or improved by eating food.  You have abdominal pain that is worsened with eating fatty foods.  You have a fever. SEEK IMMEDIATE MEDICAL CARE IF:  Your pain does not go away within 2 hours.  You keep throwing up (vomiting).  Your pain is felt only in portions of the abdomen, such as the right side or the left lower portion of the abdomen.  You pass bloody or black tarry stools. MAKE SURE YOU:  Understand these instructions.  Will watch your condition.  Will get help right away if you are not doing well or get worse.   This information is not intended to replace advice given to you by your health care provider. Make sure you discuss any questions you have with your health care provider.   Document Released: 07/19/2005 Document Revised: 06/30/2015 Document Reviewed: 06/18/2013 Elsevier Interactive Patient Education 2016 [employer]  Dysmenorrhea Menstrual cramps (dysmenorrhea) are caused by the muscles of the uterus tightening (contracting) during a menstrual period. For some women,  this discomfort is merely bothersome. For others, dysmenorrhea can be severe enough to interfere with everyday activities for a few days each month. Primary dysmenorrhea is menstrual cramps that last a couple of days when you start having menstrual periods or soon after. This often begins after a teenager starts having her period. As a woman gets older or has a  baby, the cramps will usually lessen or disappear. Secondary dysmenorrhea begins later in life, lasts longer, and the pain may be stronger than primary dysmenorrhea. The pain may start before the period and last a few days after the period.  CAUSES  Dysmenorrhea is usually caused by an underlying problem, such as:  The tissue lining the uterus grows outside of the uterus in other areas of the body (endometriosis).  The endometrial tissue, which normally lines the uterus, is found in or grows into the muscular walls of the uterus (adenomyosis).  The pelvic blood vessels are engorged with blood just before the menstrual period (pelvic congestive syndrome).  Overgrowth of cells (polyps) in the lining of the uterus or cervix.  Falling down of the uterus (prolapse) because of loose or stretched ligaments.  Depression.  Bladder problems, infection, or inflammation.  Problems with the intestine, a tumor, or irritable bowel syndrome.  Cancer of the female organs or bladder.  A severely tipped uterus.  A very tight opening or closed cervix.  Noncancerous tumors of the uterus (fibroids).  Pelvic inflammatory disease (PID).  Pelvic scarring (adhesions) from a previous surgery.  Ovarian cyst.  An intrauterine device (IUD) used for birth control. RISK FACTORS You may be at greater risk of dysmenorrhea if:  You are younger than age 53.  You started puberty early.  You have irregular or heavy bleeding.  You have never given birth.  You have a family history of this problem.  You are a smoker. SIGNS AND SYMPTOMS   Cramping or throbbing pain in your lower abdomen.  Headaches.  Lower back pain.  Nausea or vomiting.  Diarrhea.  Sweating or dizziness.  Loose stools. DIAGNOSIS  A diagnosis is based on your history, symptoms, physical exam, diagnostic tests, or procedures. Diagnostic tests or procedures may include:  Blood tests.  Ultrasonography.  An examination of  the lining of the uterus (dilation and curettage, D&C).  An examination inside your abdomen or pelvis with a scope (laparoscopy).  X-rays.  CT scan.  MRI.  An examination inside the bladder with a scope (cystoscopy).  An examination inside the intestine or stomach with a scope (colonoscopy, gastroscopy). TREATMENT  Treatment depends on the cause of the dysmenorrhea. Treatment may include:  Pain medicine prescribed by your health care provider.  Birth control pills or an IUD with progesterone hormone in it.  Hormone replacement therapy.  Nonsteroidal anti-inflammatory drugs (NSAIDs). These may help stop the production of prostaglandins.  Surgery to remove adhesions, endometriosis, ovarian cyst, or fibroids.  Removal of the uterus (hysterectomy).  Progesterone shots to stop the menstrual period.  Cutting the nerves on the sacrum that go to the female organs (presacral neurectomy).  Electric current to the sacral nerves (sacral nerve stimulation).  Antidepressant medicine.  Psychiatric therapy, counseling, or group therapy.  Exercise and physical therapy.  Meditation and yoga therapy.  Acupuncture. HOME CARE INSTRUCTIONS   Only take over-the-counter or prescription medicines as directed by your health care provider.  Place a heating pad or hot water bottle on your lower back or abdomen. Do not sleep with the heating pad.  Use aerobic  exercises, walking, swimming, biking, and other exercises to help lessen the cramping.  Massage to the lower back or abdomen may help.  Stop smoking.  Avoid alcohol and caffeine. SEEK MEDICAL CARE IF:   Your pain does not get better with medicine.  You have pain with sexual intercourse.  Your pain increases and is not controlled with medicines.  You have abnormal vaginal bleeding with your period.  You develop nausea or vomiting with your period that is not controlled with medicine. SEEK IMMEDIATE MEDICAL CARE IF:  You  pass out.    This information is not intended to replace advice given to you by your health care provider. Make sure you discuss any questions you have with your health care provider.   Document Released: 10/09/2005 Document Revised: 06/11/2013 Document Reviewed: 03/27/2013 Elsevier Interactive Patient Education [employer]

## 2016-01-11 ENCOUNTER — Emergency Department
Admission: EM | Admit: 2016-01-11 | Discharge: 2016-01-11 | Disposition: A | Discharge status: Home / Self Care | Payer: Self-pay | Attending: Emergency Medicine | Admitting: Emergency Medicine

## 2016-01-11 ENCOUNTER — Encounter: Payer: Self-pay | Admitting: Emergency Medicine

## 2016-01-11 ENCOUNTER — Emergency Department: Payer: Self-pay

## 2016-01-11 DIAGNOSIS — Z791 Long term (current) use of non-steroidal anti-inflammatories (NSAID): Secondary | ICD-10-CM | POA: Insufficient documentation

## 2016-01-11 DIAGNOSIS — E119 Type 2 diabetes mellitus without complications: Secondary | ICD-10-CM | POA: Insufficient documentation

## 2016-01-11 DIAGNOSIS — Z7984 Long term (current) use of oral hypoglycemic drugs: Secondary | ICD-10-CM | POA: Insufficient documentation

## 2016-01-11 DIAGNOSIS — R197 Diarrhea, unspecified: Secondary | ICD-10-CM | POA: Insufficient documentation

## 2016-01-11 DIAGNOSIS — Z79899 Other long term (current) drug therapy: Secondary | ICD-10-CM | POA: Insufficient documentation

## 2016-01-11 DIAGNOSIS — K5730 Diverticulosis of large intestine without perforation or abscess without bleeding: Secondary | ICD-10-CM | POA: Insufficient documentation

## 2016-01-11 DIAGNOSIS — Z87891 Personal history of nicotine dependence: Secondary | ICD-10-CM | POA: Insufficient documentation

## 2016-01-11 DIAGNOSIS — Z9049 Acquired absence of other specified parts of digestive tract: Secondary | ICD-10-CM | POA: Insufficient documentation

## 2016-01-11 DIAGNOSIS — R1084 Generalized abdominal pain: Secondary | ICD-10-CM | POA: Insufficient documentation

## 2016-01-11 LAB — URINALYSIS COMPLETE WITH MICROSCOPIC (ARMC ONLY)
BACTERIA UA: NONE SEEN
Bilirubin Urine: NEGATIVE
Glucose, UA: NEGATIVE mg/dL
HGB URINE DIPSTICK: NEGATIVE
KETONES UR: NEGATIVE mg/dL
Leukocytes, UA: NEGATIVE
NITRITE: NEGATIVE
PH: 6 (ref 5.0–8.0)
PROTEIN: NEGATIVE mg/dL
SPECIFIC GRAVITY, URINE: 1.015 (ref 1.005–1.030)

## 2016-01-11 LAB — CBC
HEMATOCRIT: 41.4 % (ref 35.0–47.0)
HEMOGLOBIN: 14.3 g/dL (ref 12.0–16.0)
MCH: 31.4 pg (ref 26.0–34.0)
MCHC: 34.5 g/dL (ref 32.0–36.0)
MCV: 91.2 fL (ref 80.0–100.0)
Platelets: 212 10*3/uL (ref 150–440)
RBC: 4.54 MIL/uL (ref 3.80–5.20)
RDW: 13.4 % (ref 11.5–14.5)
WBC: 10.1 10*3/uL (ref 3.6–11.0)

## 2016-01-11 LAB — COMPREHENSIVE METABOLIC PANEL
ALT: 18 U/L (ref 14–54)
AST: 15 U/L (ref 15–41)
Albumin: 3.8 g/dL (ref 3.5–5.0)
Alkaline Phosphatase: 63 U/L (ref 38–126)
Anion gap: 4 — ABNORMAL LOW (ref 5–15)
BUN: 16 mg/dL (ref 6–20)
CALCIUM: 9.3 mg/dL (ref 8.9–10.3)
CO2: 28 mmol/L (ref 22–32)
Chloride: 105 mmol/L (ref 101–111)
Creatinine, Ser: 1.05 mg/dL — ABNORMAL HIGH (ref 0.44–1.00)
GFR calc non Af Amer: >60 mL/min (ref >60)
Glucose, Bld: 108 mg/dL — ABNORMAL HIGH (ref 65–99)
POTASSIUM: 4.2 mmol/L (ref 3.5–5.1)
SODIUM: 137 mmol/L (ref 135–145)
TOTAL PROTEIN: 7.4 g/dL (ref 6.5–8.1)
Total Bilirubin: 0.4 mg/dL (ref 0.3–1.2)

## 2016-01-11 LAB — LIPASE, BLOOD: LIPASE: 26 U/L (ref 11–51)

## 2016-01-11 LAB — POCT PREGNANCY, URINE: PREG TEST UR: NEGATIVE

## 2016-01-11 MED ORDER — OXYCODONE-ACETAMINOPHEN 5-325 MG PO TABS: 1.0000 | ORAL_TABLET | ORAL | Status: AC | PRN

## 2016-01-11 MED ORDER — HYDROMORPHONE HCL 1 MG/ML IJ SOLN
0.5000 mg | Freq: Once | INTRAMUSCULAR | Status: AC
  Administered 2016-01-11: 0.5 mg via INTRAVENOUS
  Filled 2016-01-11: qty 1

## 2016-01-11 MED ORDER — IOHEXOL 240 MG/ML SOLN
25.0000 mL | Freq: Once | INTRAMUSCULAR | Status: DC | PRN
  Filled 2016-01-11: qty 25

## 2016-01-11 MED ORDER — ONDANSETRON HCL 4 MG/2ML IJ SOLN
4.0000 mg | Freq: Once | INTRAMUSCULAR | Status: AC
  Administered 2016-01-11: 4 mg via INTRAVENOUS
  Filled 2016-01-11: qty 2

## 2016-01-11 MED ORDER — IOPAMIDOL (ISOVUE-300) INJECTION 61%
100.0000 mL | Freq: Once | INTRAVENOUS | Status: AC | PRN
  Administered 2016-01-11: 100 mL via INTRAVENOUS
  Filled 2016-01-11: qty 100

## 2016-01-11 MED ORDER — SODIUM CHLORIDE 0.9 % IV SOLN
1000.0000 mL | Freq: Once | INTRAVENOUS | Status: AC
  Administered 2016-01-11: 1000 mL via INTRAVENOUS

## 2016-01-11 NOTE — ED Notes (Signed)
Pt alert and oriented X4, active, cooperative, pt in NAD. RR even and unlabored, color WNL.  Pt informed to return if any life threatening symptoms occur.   Pt going to room of sister, staying with sister, is not driving.

## 2016-01-11 NOTE — ED Notes (Signed)
Called for pt in waiting room x 3, no answer.  

## 2016-01-11 NOTE — ED Notes (Signed)
Pt to ed with c/o abd pain that started about 2 days ago, reports diarrhea, denies vomiting.

## 2016-01-11 NOTE — ED Notes (Signed)
PT held in room until now after IV pain mediation.

## 2016-01-11 NOTE — ED Provider Notes (Signed)
[facility] Emergency Department Provider Note  ____________________________________________    I have reviewed the triage vital signs and the nursing notes.   HISTORY  Chief Complaint Abdominal Pain and Diarrhea    HPI [name] is a 40 y.o. female who presents with complaints of moderate to severe, cramping diffuse abdominal pain. She reports this pain started 2 days ago and has steadily worsened. She reports diarrhea but denies vomiting. She denies fevers or chills. She has a history of a partial colectomy secondary to diverticulitis. She is also diabetic. She denies dysuria.     Past Medical History  Diagnosis Date  . Diverticulitis   . Kidney stones   . Diabetes mellitus without complication (HCC)   . Bilateral ovarian cysts     There are no active problems to display for this patient.   Past Surgical History  Procedure Laterality Date  . Abdominal surgery      colon resection  . Ankle surgery    . Ectopic pregnancy surgery    . Colectomy      Current Outpatient Rx  Name  Route  Sig  Dispense  Refill  . acetaminophen (TYLENOL) 500 MG tablet   Oral   Take 1,000 mg by mouth every 6 (six) hours as needed for moderate pain.         . diazepam (VALIUM) 5 MG tablet   Oral   Take 1 tablet (5 mg total) by mouth every 8 (eight) hours as needed for muscle spasms.   8 tablet   0   . docusate sodium (COLACE) 100 MG capsule      Take 1 tablet once or twice daily as needed for constipation while taking narcotic pain medicine   30 capsule   0   . HYDROcodone-acetaminophen (NORCO/VICODIN) 5-325 MG tablet   Oral   Take 1 tablet by mouth every 4 (four) hours as needed for moderate pain.   20 tablet   0   . metFORMIN (GLUCOPHAGE) 500 MG tablet   Oral   Take 500 mg by mouth daily with breakfast.         . naproxen (NAPROSYN) 500 MG tablet   Oral   Take 1 tablet (500 mg total) by mouth 2 (two) times daily with a meal.   20  tablet   0   . ondansetron (ZOFRAN) 4 MG tablet   Oral   Take 1 tablet (4 mg total) by mouth every 6 (six) hours.   12 tablet   0   . oxyCODONE-acetaminophen (ROXICET) 5-325 MG tablet   Oral   Take 1-2 tablets by mouth every 4 (four) hours as needed for severe pain.   20 tablet   0     Allergies Morphine and related and Latex  History reviewed. No pertinent family history.  Social History Social History  Substance Use Topics  . Smoking status: Former Smoker    Quit date: 09/15/2011  . Smokeless tobacco: None  . Alcohol Use: No    Review of Systems  Constitutional: Negative for fever. Eyes: Negative for redness ENT: Negative for sore throat Cardiovascular: Negative for chest pain Respiratory: Negative for shortness of breath.No cough Gastrointestinal:Positive abdominal pain as above him and diarrhea Genitourinary: Negative for dysuria. Musculoskeletal: Negative for back pain. Skin: Negative for rash. Neurological: Negative for focal weakness Psychiatric: Anxious    ____________________________________________   PHYSICAL EXAM:  VITAL SIGNS:   ED Triage Vitals  Enc Vitals Group  BP --      Pulse --      Resp --      Temp --      Temp src --      SpO2 --      Weight --      Height --      Head Cir --      Peak Flow --      Pain Score 01/11/16 0901 9     Pain Loc --      Pain Edu? --      Excl. in GC? --      Constitutional: Alert and oriented. Patient is anxious with no acute distress Eyes: Conjunctivae are normal. No erythema or injection ENT   Head: Normocephalic and atraumatic.   Mouth/Throat: Mucous membranes are moist. Cardiovascular: Normal rate, regular rhythm. Normal and symmetric distal pulses are present in the upper extremities.  Respiratory: Normal respiratory effort without tachypnea nor retractions. Breath sounds are clear and equal bilaterally.  Gastrointestinal: Mild tender to palpation upper abdomen, mild  distention. . There is no CVA tenderness. Genitourinary: deferred Musculoskeletal: Nontender with normal range of motion in all extremities.  Neurologic:  Normal speech and language. No gross focal neurologic deficits are appreciated. Skin:  Skin is warm, dry and intact. No rash noted. Psychiatric: Mood and affect are normal. Patient exhibits appropriate insight and judgment.  ____________________________________________    LABS (pertinent positives/negatives)  Labs Reviewed  COMPREHENSIVE METABOLIC PANEL - Abnormal; Notable for the following:    Glucose, Bld 108 (*)    Creatinine, Ser 1.05 (*)    Anion gap 4 (*)    All other components within normal limits  URINALYSIS COMPLETEWITH MICROSCOPIC ([facility] ONLY) - Abnormal; Notable for the following:    Color, Urine STRAW (*)    APPearance CLEAR (*)    Squamous Epithelial / LPF 0-5 (*)    All other components within normal limits  LIPASE, BLOOD  CBC  POC URINE PREG, ED  POCT PREGNANCY, URINE    ____________________________________________   EKG None  ____________________________________________    RADIOLOGY  CT abdomen pelvis pending  ____________________________________________   PROCEDURES  Procedure(s) performed: none  Critical Care performed: none  ____________________________________________   INITIAL IMPRESSION / ASSESSMENT AND PLAN / ED COURSE  Pertinent labs & imaging results that were available during my care of the patient were reviewed by me and considered in my medical decision making (see chart for details).  Patient with a history of partial colectomy presents with abdominal pain and diarrhea. She has mild tenderness in the upper abdomen. Her lab work is reassuring. We will give Dilaudid 0.5 mg IV, Zofran IV, fluids IV. We will obtain CT abdomen and pelvis and reevaluate  I have asked PA to follow up on CT scan results and disposition after discussion with supervising  physician  ____________________________________________   FINAL CLINICAL IMPRESSION(S) / ED DIAGNOSES  Abdominal pain       Jene Everyobert Keyonna [place], MD 01/11/16 1158

## 2016-01-11 NOTE — ED Provider Notes (Signed)
Patient not evaluated by me.  Shannon CheekPhillip Yavier Snider, MD 01/11/16 1126

## 2016-01-11 NOTE — Discharge Instructions (Signed)

## 2016-01-11 NOTE — ED Notes (Signed)
Patient transported to CT 

## 2016-08-22 IMAGING — CT CT ABD-PELV W/ CM
1 of 3 series · 13 of 32 positions shown, 18 images · IV contrast (omnipaque)
Comparison: February 09, 2015

CLINICAL DATA: Abdominal pain with nausea for 3 days

EXAM:
CT ABDOMEN AND PELVIS WITH CONTRAST
TECHNIQUE: Multidetector CT imaging of the abdomen and pelvis was performed
using the standard protocol following bolus administration of
intravenous contrast. Oral contrast was also administered.
CONTRAST:  100mL OMNIPAQUE IOHEXOL 350 MG/ML SOLN

[Series 2: routine abd pel with · axial · 0.82mm/px · z∈[-162,+238]mm · 13 of 92 slices shown, 18 images]
[im 6/92  soft-tissue]
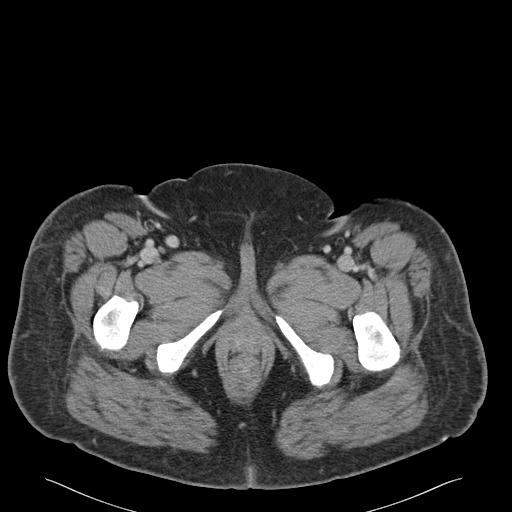
[im 6/92  bone]
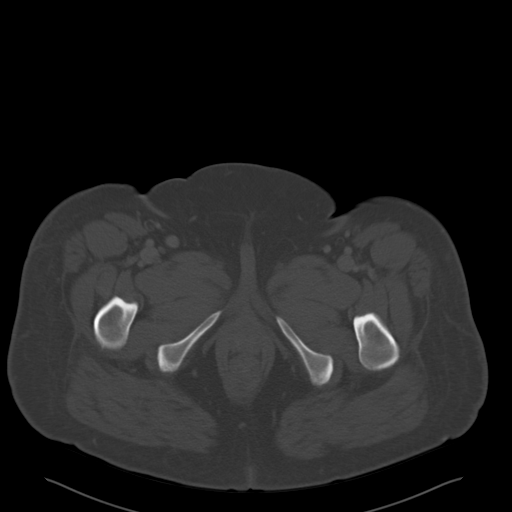
[im 16/92  soft-tissue]
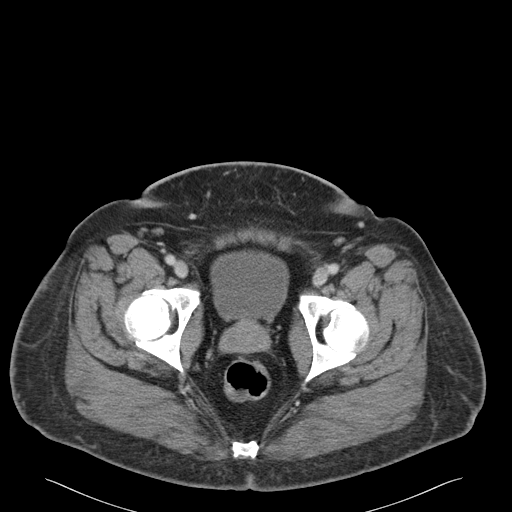
[im 21/92  soft-tissue]
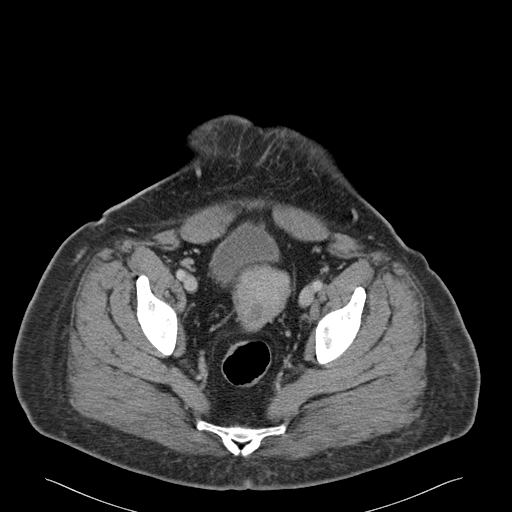
[im 26/92  soft-tissue]
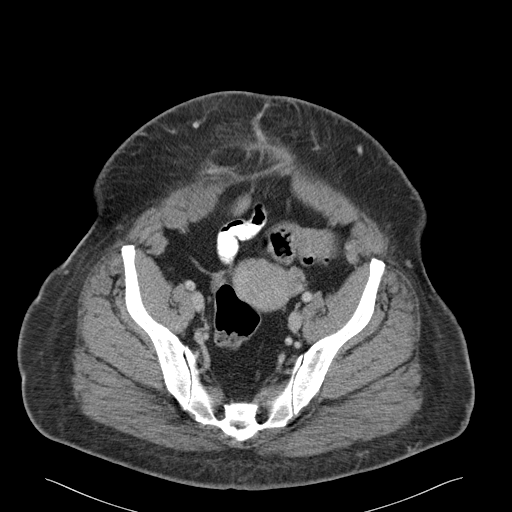
[im 36/92  soft-tissue]
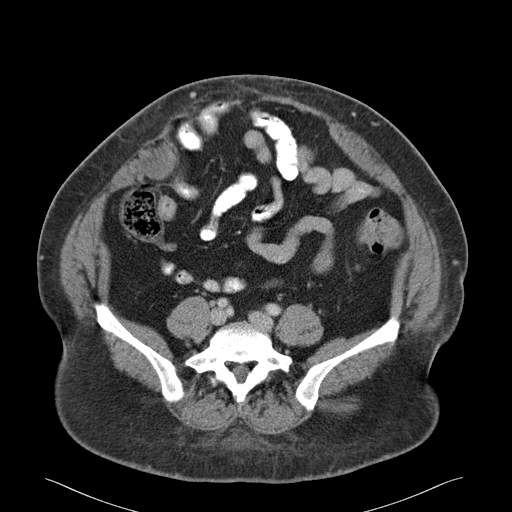
[im 41/92  soft-tissue]
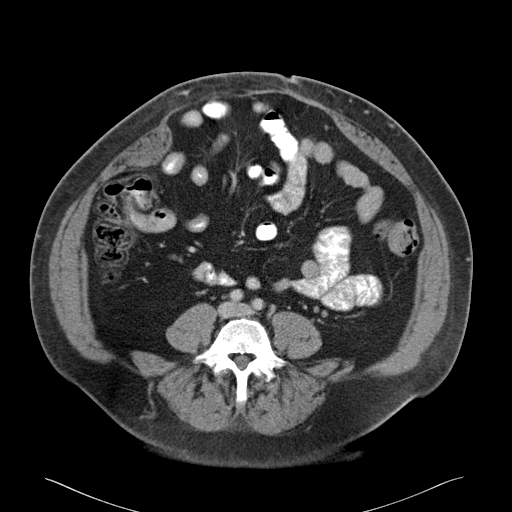
[im 51/92  soft-tissue]
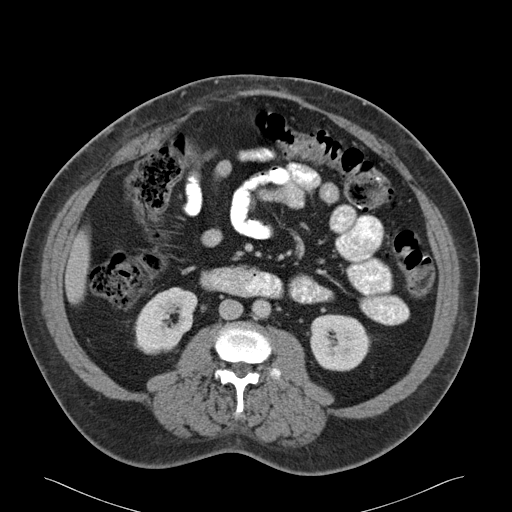
[im 56/92  soft-tissue]
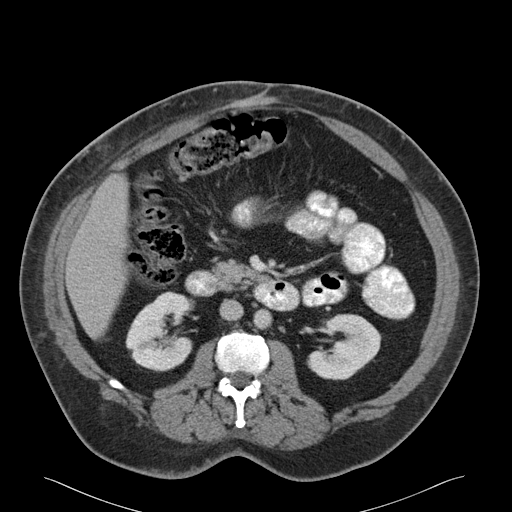
[im 66/92  soft-tissue]
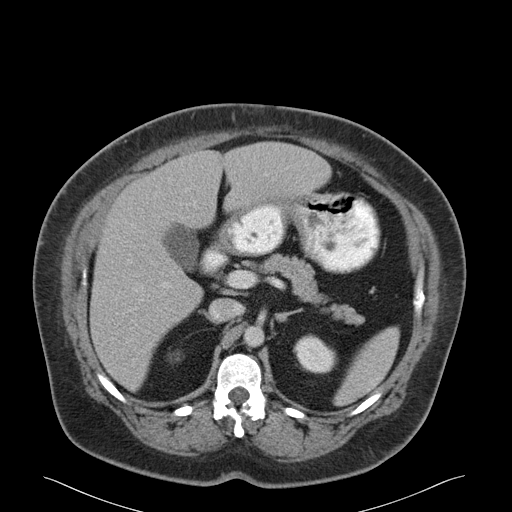
[im 66/92  bone]
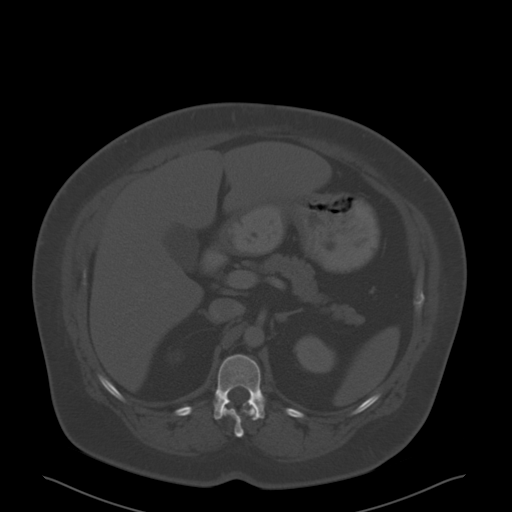
[im 71/92  soft-tissue]
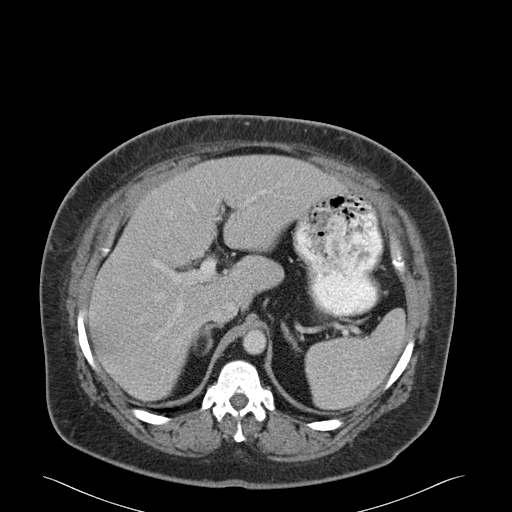
[im 71/92  lung]
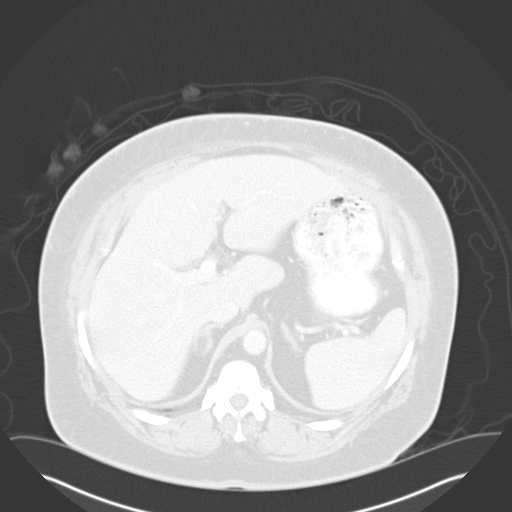
[im 76/92  soft-tissue]
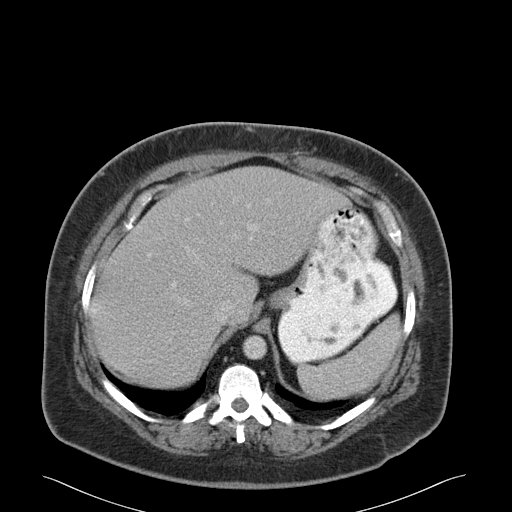
[im 76/92  lung]
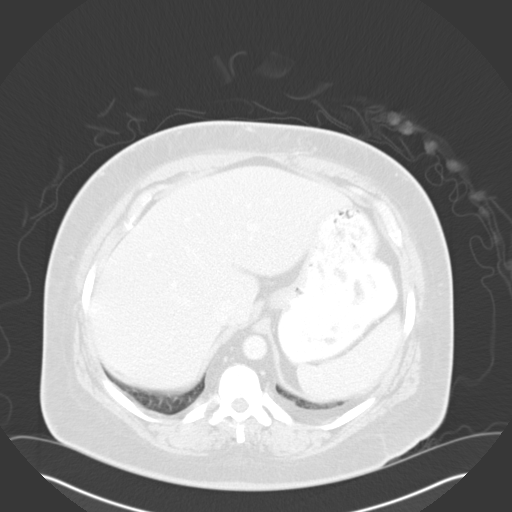
[im 81/92  lung]
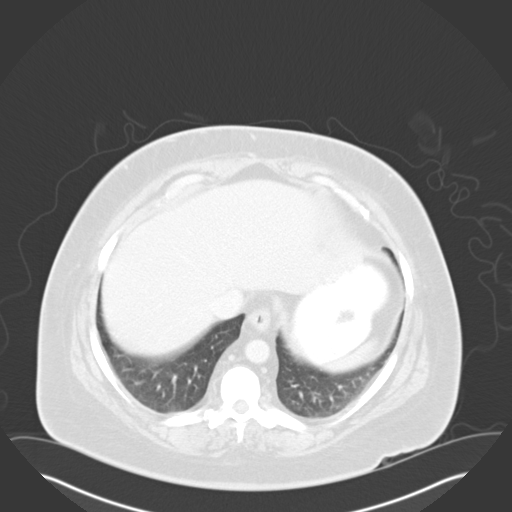
[im 86/92  soft-tissue]
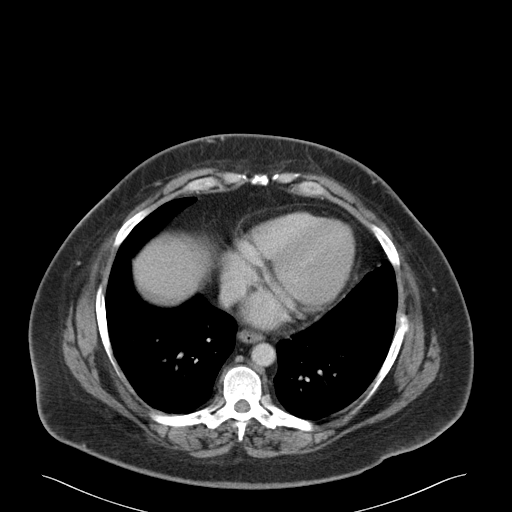
[im 86/92  lung]
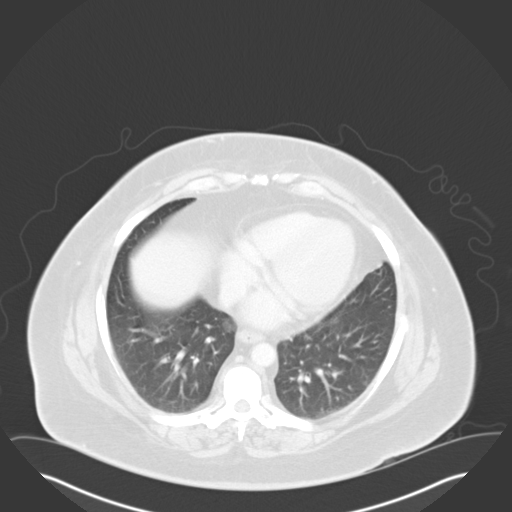

[13 of 32 positions shown; findings below may reference images not displayed]

FINDINGS: Lower chest:  Lung bases are clear.

Hepatobiliary: Liver is prominent, measuring 21.2 cm in length.
There is a Riedel's lobe on the right. No focal liver lesions are
identified. The gallbladder wall is not appreciably thickened. There
is no biliary duct dilatation.

Pancreas: No pancreatic mass or inflammatory focus appreciable.

Spleen: No demonstrable splenic lesion.

Adrenals/Urinary Tract: Adrenals appear normal bilaterally. There is
a 1 mm nonobstructing calculus mid right kidney. There is mild
scarring in the right kidney, stable. There is no renal mass or
hydronephrosis on either side. There is no ureteral calculus on
either side. Urinary bladder is midline with wall thickness within
normal limits.

Stomach/Bowel: There are scattered sigmoid diverticulosis. Mild wall
thickening in the proximal sigmoid colon is again noted, likely due
to muscular hypertrophy from diverticulosis. There is no surrounding
mesenteric thickening or stranding to indicate overt diverticulitis.
There is no other evidence of bowel wall thickening. No mesenteric
thickening is seen on this study. No bowel obstruction. No free air
or portal venous air.

Vascular/Lymphatic: There is no abdominal aortic aneurysm. No
vascular lesions are appreciable. The major mesenteric vessels
appear widely patent. There is no ascites in the abdomen or pelvis.

Reproductive: Uterus is anteverted. No intrauterine mass is
appreciable by CT. There is an apparent nabothian cyst in the cervix
measuring 1.2 x 1.1 cm. There is no other pelvic mass. No pelvic
fluid collection.

Other: Appendix appears normal. No abscess or ascites in the abdomen
or pelvis. There is a small left paramedian ventral hernia
containing only fat.

Musculoskeletal: Scarring in the anterior abdominal wall is stable.
There are no blastic or lytic bone lesions. There are no
intramuscular lesions.
IMPRESSION: There is again noted wall thickening in the proximal sigmoid colon.
No surrounding inflammation. Suspect localized muscular hypertrophy
from diverticulosis. Earliest changes of diverticulitis in this area
are possible, however.

No bowel obstruction.  No abscess.  Appendix appears normal.

Small left ventral hernia containing only fat.

1 mm nonobstructing calculus mid right kidney. No hydronephrosis on
either side. No ureteral calculi on either side.

Nabothian cyst in cervix, a benign finding.
